# Patient Record
Sex: Female | Born: 1985 | Race: White | Hispanic: No | Smoking: Never smoker
Health system: Southern US, Community
[De-identification: ages and names within clinical notes are randomized; demographics above are authoritative.]

## PROBLEM LIST (undated history)

## (undated) DIAGNOSIS — O139 Gestational [pregnancy-induced] hypertension without significant proteinuria, unspecified trimester: Secondary | ICD-10-CM

## (undated) DIAGNOSIS — L709 Acne, unspecified: Secondary | ICD-10-CM

## (undated) DIAGNOSIS — R87629 Unspecified abnormal cytological findings in specimens from vagina: Secondary | ICD-10-CM

## (undated) DIAGNOSIS — O24419 Gestational diabetes mellitus in pregnancy, unspecified control: Secondary | ICD-10-CM

## (undated) HISTORY — PX: OTHER SURGICAL HISTORY: SHX169

## (undated) HISTORY — DX: Acne, unspecified: L70.9

## (undated) HISTORY — DX: Gestational diabetes mellitus in pregnancy, unspecified control: O24.419

## (undated) HISTORY — PX: TONSILLECTOMY: SUR1361

## (undated) HISTORY — PX: DRUG INDUCED ENDOSCOPY: SHX6808

## (undated) HISTORY — DX: Unspecified abnormal cytological findings in specimens from vagina: R87.629

## (undated) HISTORY — DX: Gestational (pregnancy-induced) hypertension without significant proteinuria, unspecified trimester: O13.9

---
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
## 2008-12-14 HISTORY — PX: CHOLECYSTECTOMY: SHX55

## 2008-12-30 ENCOUNTER — Ambulatory Visit: Payer: Self-pay | Admitting: Internal Medicine

## 2008-12-30 ENCOUNTER — Inpatient Hospital Stay (HOSPITAL_COMMUNITY): Admission: EM | Admit: 2008-12-30 | Discharge: 2009-01-06 | Payer: Self-pay | Admitting: Emergency Medicine

## 2009-01-05 ENCOUNTER — Encounter (INDEPENDENT_AMBULATORY_CARE_PROVIDER_SITE_OTHER): Payer: Self-pay | Admitting: General Surgery

## 2010-07-20 LAB — HEPATIC FUNCTION PANEL
Bilirubin, Direct: 0.4 mg/dL — ABNORMAL HIGH (ref 0.0–0.3)
Indirect Bilirubin: 0.8 mg/dL (ref 0.3–0.9)
Total Bilirubin: 1.2 mg/dL (ref 0.3–1.2)
Total Protein: 6.3 g/dL (ref 6.0–8.3)

## 2010-07-20 LAB — DIFFERENTIAL
Basophils Absolute: 0 10*3/uL (ref 0.0–0.1)
Basophils Absolute: 0.1 10*3/uL (ref 0.0–0.1)
Basophils Relative: 1 % (ref 0–1)
Eosinophils Absolute: 0 10*3/uL (ref 0.0–0.7)
Eosinophils Absolute: 0.1 10*3/uL (ref 0.0–0.7)
Eosinophils Relative: 1 % (ref 0–5)
Lymphocytes Relative: 15 % (ref 12–46)
Lymphs Abs: 1.4 10*3/uL (ref 0.7–4.0)
Monocytes Absolute: 0.8 10*3/uL (ref 0.1–1.0)
Monocytes Relative: 6 % (ref 3–12)
Monocytes Relative: 7 % (ref 3–12)
Neutro Abs: 9.6 10*3/uL — ABNORMAL HIGH (ref 1.7–7.7)

## 2010-07-20 LAB — URINALYSIS, ROUTINE W REFLEX MICROSCOPIC
Glucose, UA: NEGATIVE mg/dL
Ketones, ur: 15 mg/dL — AB
Nitrite: NEGATIVE
Protein, ur: NEGATIVE mg/dL
Urobilinogen, UA: 1 mg/dL (ref 0.0–1.0)
pH: 5 (ref 5.0–8.0)

## 2010-07-20 LAB — CBC
HCT: 38 % (ref 36.0–46.0)
HCT: 43.7 % (ref 36.0–46.0)
Hemoglobin: 12.4 g/dL (ref 12.0–15.0)
Hemoglobin: 12.9 g/dL (ref 12.0–15.0)
Hemoglobin: 14.9 g/dL (ref 12.0–15.0)
MCHC: 33.8 g/dL (ref 30.0–36.0)
MCHC: 34 g/dL (ref 30.0–36.0)
MCHC: 34 g/dL (ref 30.0–36.0)
MCHC: 34 g/dL (ref 30.0–36.0)
MCV: 86.3 fL (ref 78.0–100.0)
MCV: 86.7 fL (ref 78.0–100.0)
Platelets: 180 10*3/uL (ref 150–400)
Platelets: 196 10*3/uL (ref 150–400)
Platelets: 239 10*3/uL (ref 150–400)
RBC: 4.41 MIL/uL (ref 3.87–5.11)
RDW: 13.1 % (ref 11.5–15.5)
RDW: 13.5 % (ref 11.5–15.5)
RDW: 13.5 % (ref 11.5–15.5)
WBC: 12.1 10*3/uL — ABNORMAL HIGH (ref 4.0–10.5)
WBC: 4.5 10*3/uL (ref 4.0–10.5)

## 2010-07-20 LAB — COMPREHENSIVE METABOLIC PANEL
ALT: 141 U/L — ABNORMAL HIGH (ref 0–35)
ALT: 232 U/L — ABNORMAL HIGH (ref 0–35)
AST: 33 U/L (ref 0–37)
AST: 63 U/L — ABNORMAL HIGH (ref 0–37)
Alkaline Phosphatase: 85 U/L (ref 39–117)
Alkaline Phosphatase: 98 U/L (ref 39–117)
BUN: 4 mg/dL — ABNORMAL LOW (ref 6–23)
BUN: 8 mg/dL (ref 6–23)
CO2: 27 mEq/L (ref 19–32)
CO2: 28 mEq/L (ref 19–32)
Calcium: 9.1 mg/dL (ref 8.4–10.5)
Chloride: 104 mEq/L (ref 96–112)
GFR calc Af Amer: >60 mL/min (ref >60)
GFR calc Af Amer: >60 mL/min (ref >60)
GFR calc non Af Amer: >60 mL/min (ref >60)
GFR calc non Af Amer: >60 mL/min (ref >60)
Glucose, Bld: 91 mg/dL (ref 70–99)
Potassium: 3.3 mEq/L — ABNORMAL LOW (ref 3.5–5.1)
Potassium: 3.7 mEq/L (ref 3.5–5.1)
Sodium: 136 mEq/L (ref 135–145)
Sodium: 137 mEq/L (ref 135–145)
Sodium: 139 mEq/L (ref 135–145)
Total Bilirubin: 1.3 mg/dL — ABNORMAL HIGH (ref 0.3–1.2)
Total Protein: 6.5 g/dL (ref 6.0–8.3)

## 2010-07-20 LAB — URINE MICROSCOPIC-ADD ON

## 2010-07-20 LAB — MAGNESIUM: Magnesium: 1.9 mg/dL (ref 1.5–2.5)

## 2010-07-20 LAB — LIPASE, BLOOD: Lipase: 24 U/L (ref 11–59)

## 2010-07-20 LAB — PHOSPHORUS: Phosphorus: 3.3 mg/dL (ref 2.3–4.6)

## 2010-07-20 LAB — AMYLASE: Amylase: 130 U/L (ref 27–131)

## 2013-04-27 ENCOUNTER — Encounter: Payer: Self-pay | Admitting: Family Medicine

## 2013-04-27 ENCOUNTER — Ambulatory Visit (INDEPENDENT_AMBULATORY_CARE_PROVIDER_SITE_OTHER): Payer: PRIVATE HEALTH INSURANCE | Admitting: Family Medicine

## 2013-04-27 VITALS — BP 134/82 | HR 82 | Resp 16 | Wt 250.0 lb

## 2013-04-27 DIAGNOSIS — L708 Other acne: Secondary | ICD-10-CM

## 2013-04-27 DIAGNOSIS — R599 Enlarged lymph nodes, unspecified: Secondary | ICD-10-CM

## 2013-04-27 DIAGNOSIS — L700 Acne vulgaris: Secondary | ICD-10-CM

## 2013-04-27 DIAGNOSIS — E669 Obesity, unspecified: Secondary | ICD-10-CM

## 2013-04-27 DIAGNOSIS — R590 Localized enlarged lymph nodes: Secondary | ICD-10-CM

## 2013-04-27 DIAGNOSIS — Z23 Encounter for immunization: Secondary | ICD-10-CM

## 2013-04-27 MED ORDER — DOXYCYCLINE HYCLATE 50 MG PO CAPS: 50.0000 mg | ORAL_CAPSULE | Freq: Every day | ORAL | Status: DC

## 2013-04-27 MED ORDER — ADAPALENE-BENZOYL PEROXIDE 0.1-2.5 % EX GEL: 1.0000 "application " | Freq: Every day | CUTANEOUS | Status: DC

## 2013-04-27 NOTE — Progress Notes (Signed)
Subjective:    Patient ID: Julia [name], female    DOB: 09/15/1985, 28 y.o.   MRN: [id number]  HPI She's here to establish care today. She has had a lump under her chin for the last 2 months. It does seem to be resolving. He has never been painful or tender. She just noticed it was gradually getting larger over Christmas and over the last week has actually gotten a little bit smaller. She denies any recent illnesses fevers or chills or sweats. She does not take any medications.    Says her acne has really flared in 2 weeks.  Used to have acne on her back and chest but that has better.  Uses neturogena for her face wash.  Used ot use proactive. Used it for 2 years and then quit working.  She says her skin is very sensitive. When she comes in contact with other things it tends to break her out very easily.   Review of Systems  Constitutional: Negative for fever, diaphoresis and unexpected weight change.  HENT: Negative for hearing loss, rhinorrhea and tinnitus.   Eyes: Negative for visual disturbance.  Respiratory: Negative for cough and wheezing.   Cardiovascular: Negative for chest pain and palpitations.  Gastrointestinal: Negative for nausea, vomiting, diarrhea and blood in stool.  Genitourinary: Negative for vaginal bleeding, vaginal discharge and difficulty urinating.  Musculoskeletal: Negative for arthralgias and myalgias.  Skin: Negative for rash.  Neurological: Negative for headaches.  Hematological: Positive for adenopathy. Does not bruise/bleed easily.  Psychiatric/Behavioral: Positive for dysphoric mood. Negative for sleep disturbance. The patient is not nervous/anxious.     BP 134/82  Pulse 82  Resp 16  Wt 250 lb (113.399 kg)  SpO2 99%    Allergies  Allergen Reactions  . Morphine And Related     Grandfather died from morphine    History reviewed. No pertinent past medical history.  Past Surgical History  Procedure Laterality Date  . Cholecystectomy  12/2008     History   Social History  . Marital Status: Single    Spouse Name: N/A    Number of Children: N/A  . Years of Education: HS   Occupational History  . shipping/Receiving clear     [employer]   Social History Main Topics  . Smoking status: Never Smoker   . Smokeless tobacco: Never Used  . Alcohol Use: No  . Drug Use: No  . Sexual Activity: Yes    Partners: Male   Other Topics Concern  . Not on file   Social History Narrative   2 bottles of soda per day.  No regular exericise.  Lives with her mother and brother.    Family History  Problem Relation Age of Onset  . Alcohol abuse Father     Outpatient Encounter Prescriptions as of 04/27/2013  Medication Sig  . ibuprofen (ADVIL,MOTRIN) 200 MG tablet Take 200 mg by mouth every 6 (six) hours as needed.  . Adapalene-Benzoyl Peroxide (EPIDUO) 0.1-2.5 % gel Apply 1 application topically at bedtime.  Marland Kitchen doxycycline (VIBRAMYCIN) 50 MG capsule Take 1 capsule (50 mg total) by mouth daily.          Objective:   Physical Exam  Constitutional: She is oriented to person, place, and time. She appears well-developed and well-nourished.  HENT:  Head: Normocephalic and atraumatic.  Neck:  She does have a mildly swollen submandibular gland. It does not feel hard or indurated. It is nontender. No rash or fever around it.  Cardiovascular:  Normal rate, regular rhythm and normal heart sounds.   Pulmonary/Chest: Effort normal and breath sounds normal.  Neurological: She is alert and oriented to person, place, and time.  Skin: Skin is warm and dry.  She does have cystic acne really concentrated over her cheeks and chin.  Psychiatric: She has a normal mood and affect. Her behavior is normal.          Assessment & Plan:  Cystic acne - we discussed treatment options. She's really never used prescription treatment. Recommend a trial of epidural. Samples given today. If it's too expensive at the local pharmacy then please call me back.  We'll also treat with oral doxycycline 50 mg daily. She's not seeing some improvement within 4-6 weeks we can increase her dose to 100 mg.  Swollen lymph node - seems to be resolving on its own. Most suspicious for enlarged lymph node. May have been secondary to her cystic acne. If not completely resolving over the next 4 weeks and please let me know and we will do a CBC at that time.  Tdap updated today.  She will call to schedule a physical with Pap smear she has never had a Pap smear and has been socially active for 4 years at the end of the month.

## 2013-05-14 ENCOUNTER — Encounter: Payer: PRIVATE HEALTH INSURANCE | Admitting: Family Medicine

## 2013-07-26 ENCOUNTER — Other Ambulatory Visit: Payer: Self-pay | Admitting: Family Medicine

## 2013-07-26 ENCOUNTER — Telehealth: Payer: Self-pay | Admitting: *Deleted

## 2013-07-26 ENCOUNTER — Ambulatory Visit (INDEPENDENT_AMBULATORY_CARE_PROVIDER_SITE_OTHER): Payer: PRIVATE HEALTH INSURANCE | Admitting: Family Medicine

## 2013-07-26 ENCOUNTER — Encounter: Payer: Self-pay | Admitting: Family Medicine

## 2013-07-26 VITALS — BP 137/79 | HR 101 | Wt 250.0 lb

## 2013-07-26 DIAGNOSIS — R319 Hematuria, unspecified: Secondary | ICD-10-CM

## 2013-07-26 DIAGNOSIS — R1031 Right lower quadrant pain: Secondary | ICD-10-CM

## 2013-07-26 LAB — CBC WITH DIFFERENTIAL/PLATELET
BASOS ABS: 0 10*3/uL (ref 0.0–0.1)
BASOS PCT: 0 % (ref 0–1)
EOS ABS: 0.1 10*3/uL (ref 0.0–0.7)
EOS PCT: 2 % (ref 0–5)
HEMATOCRIT: 41.1 % (ref 36.0–46.0)
HEMOGLOBIN: 14 g/dL (ref 12.0–15.0)
Lymphocytes Relative: 33 % (ref 12–46)
Lymphs Abs: 1.9 10*3/uL (ref 0.7–4.0)
MCH: 27.6 pg (ref 26.0–34.0)
MCHC: 34.1 g/dL (ref 30.0–36.0)
MCV: 81.1 fL (ref 78.0–100.0)
MONO ABS: 0.6 10*3/uL (ref 0.1–1.0)
MONOS PCT: 10 % (ref 3–12)
Neutro Abs: 3.2 10*3/uL (ref 1.7–7.7)
Neutrophils Relative %: 55 % (ref 43–77)
Platelets: 284 10*3/uL (ref 150–400)
RBC: 5.07 MIL/uL (ref 3.87–5.11)
RDW: 14.5 % (ref 11.5–15.5)
WBC: 5.8 10*3/uL (ref 4.0–10.5)

## 2013-07-26 LAB — COMPLETE METABOLIC PANEL WITH GFR
ALT: 23 U/L (ref 0–35)
AST: 20 U/L (ref 0–37)
Albumin: 4.4 g/dL (ref 3.5–5.2)
Alkaline Phosphatase: 48 U/L (ref 39–117)
BILIRUBIN TOTAL: 0.5 mg/dL (ref 0.2–1.2)
BUN: 10 mg/dL (ref 6–23)
CO2: 25 mEq/L (ref 19–32)
CREATININE: 0.79 mg/dL (ref 0.50–1.10)
Calcium: 9.8 mg/dL (ref 8.4–10.5)
Chloride: 107 mEq/L (ref 96–112)
GLUCOSE: 87 mg/dL (ref 70–99)
Potassium: 4.9 mEq/L (ref 3.5–5.3)
Sodium: 140 mEq/L (ref 135–145)
Total Protein: 7.5 g/dL (ref 6.0–8.3)

## 2013-07-26 LAB — POCT URINALYSIS DIPSTICK
BILIRUBIN UA: NEGATIVE
Glucose, UA: NEGATIVE
KETONES UA: NEGATIVE
LEUKOCYTES UA: NEGATIVE
NITRITE UA: NEGATIVE
Protein, UA: 100
Spec Grav, UA: 1.025
Urobilinogen, UA: 0.2
pH, UA: 5.5

## 2013-07-26 LAB — POCT URINE PREGNANCY: Preg Test, Ur: NEGATIVE

## 2013-07-26 NOTE — Progress Notes (Signed)
Subjective:    Patient ID: Julia [name], female    DOB: 05/28/1985, 28 y.o.   MRN: [id number]  HPI RLQ - x 3 wks pain comes and goes lower right side of abdomen. Says when it happens it is sharp and sudden. It doesn't build up.  Says triggers sometime with urination, with BM and when kids sit on her lap.   She is worried about endometriosis and diabetes. Pain will usually last 2 hours. Still has her appendix.  No nausea but gets dizzy with it.  No fever.  Sometimes adding pressure helps it as well.  No constipation. No blood in the stool. She has had one abdominal surgery, cholecystectomy. No C-section et Karie Sodacetera. No blood in the stool. No recent illnesses. She's not currently trying to get pregnant though she is not on birth control.  Review of Systems  BP 137/79  Pulse 101  Wt 250 lb (113.399 kg)    Allergies  Allergen Reactions  . Morphine And Related     Grandfather died from morphine    No past medical history on file.  Past Surgical History  Procedure Laterality Date  . Cholecystectomy  12/2008    History   Social History  . Marital Status: Single    Spouse Name: N/A    Number of Children: N/A  . Years of Education: HS   Occupational History  . shipping/Receiving clear     [employer]   Social History Main Topics  . Smoking status: Never Smoker   . Smokeless tobacco: Never Used  . Alcohol Use: No  . Drug Use: No  . Sexual Activity: Yes    Partners: Male   Other Topics Concern  . Not on file   Social History Narrative   2 bottles of soda per day.  No regular exericise.  Lives with her mother and brother.    Family History  Problem Relation Age of Onset  . Alcohol abuse Father     Outpatient Encounter Prescriptions as of 07/26/2013  Medication Sig  . ibuprofen (ADVIL,MOTRIN) 200 MG tablet Take 200 mg by mouth every 6 (six) hours as needed.  . [DISCONTINUED] Adapalene-Benzoyl Peroxide (EPIDUO) 0.1-2.5 % gel Apply 1 application topically at bedtime.  .  [DISCONTINUED] doxycycline (VIBRAMYCIN) 50 MG capsule Take 1 capsule (50 mg total) by mouth daily.          Objective:   Physical Exam  Constitutional: She is oriented to person, place, and time. She appears well-developed and well-nourished.  HENT:  Head: Normocephalic and atraumatic.  Cardiovascular: Normal rate, regular rhythm and normal heart sounds.   Pulmonary/Chest: Effort normal and breath sounds normal.  Abdominal: Soft. Bowel sounds are normal. She exhibits no distension and no mass. There is tenderness. There is no rebound and no guarding.  TTP in the RLQ, LLQ and suprapubic.   Neurological: She is alert and oriented to person, place, and time.  Skin: Skin is warm and dry.  Psychiatric: She has a normal mood and affect. Her behavior is normal.          Assessment & Plan:  RLQ  Pain - unclear etiology. Could be assessed him every, versus endometriosis, versus UTI versus appendicitis (subacute).  Will check urinalysis as well as a CMP and a CBC. If normal will consider CT of abdomen for further evaluation since this is been going on for him is 3 weeks at this point in time. Recommend Tylenol or ibuprofen for acute pain relief. She is  no pregnant. Needs UA and upreg.

## 2013-07-26 NOTE — Telephone Encounter (Signed)
PA obtained for CT ABD/Pelvis w/contrast. Auth # [id number]. Good until 08/28/13.  Meyer CoryMisty Suhan Paci, LPN

## 2013-07-26 NOTE — Progress Notes (Signed)
Quick Note:  All labs are normal. ______ 

## 2013-07-28 LAB — URINE CULTURE: Colony Count: 8000

## 2013-07-29 ENCOUNTER — Other Ambulatory Visit: Payer: PRIVATE HEALTH INSURANCE

## 2013-08-06 ENCOUNTER — Ambulatory Visit (INDEPENDENT_AMBULATORY_CARE_PROVIDER_SITE_OTHER): Payer: PRIVATE HEALTH INSURANCE

## 2013-08-06 DIAGNOSIS — R1031 Right lower quadrant pain: Secondary | ICD-10-CM

## 2013-08-06 MED ORDER — IOHEXOL 300 MG/ML  SOLN
100.0000 mL | Freq: Once | INTRAMUSCULAR | Status: AC | PRN
  Administered 2013-08-06: 100 mL via INTRAVENOUS

## 2013-10-08 ENCOUNTER — Ambulatory Visit (INDEPENDENT_AMBULATORY_CARE_PROVIDER_SITE_OTHER): Payer: PRIVATE HEALTH INSURANCE | Admitting: Family

## 2013-10-08 ENCOUNTER — Encounter: Payer: Self-pay | Admitting: Family

## 2013-10-08 VITALS — BP 124/84 | HR 72 | Resp 16 | Ht 63.0 in | Wt 248.0 lb

## 2013-10-08 DIAGNOSIS — Z01419 Encounter for gynecological examination (general) (routine) without abnormal findings: Secondary | ICD-10-CM

## 2013-10-08 DIAGNOSIS — Z124 Encounter for screening for malignant neoplasm of cervix: Secondary | ICD-10-CM

## 2013-10-08 NOTE — Progress Notes (Signed)
  Subjective:     Julia [name] is a 28 y.o. female here for a routine exam.  Current complaints: noticed lump on right breast approximated 1.5 wks ago.  Non-painful.  Patient's last menstrual period was 09/20/2013.  Cycles are regular, occurs every 30 days, lasts 7 days.  Personal health questionnaire reviewed: yes.   Gynecologic History Patient's last menstrual period was 09/20/2013. Contraception: None, not in a relationship now.   Last Pap: Never had a pap. +sexually active in the past.   Last mammogram: never.   Obstetric History OB History  Gravida Para Term Preterm AB SAB TAB Ectopic Multiple Living  0 0 0 0 0 0 0 0 0 0          The following portions of the patient's history were reviewed and updated as appropriate: allergies, current medications, past family history, past medical history, past social history, past surgical history and problem list.  Review of Systems Pertinent items are noted in HPI.    Objective:   BP 124/84  Pulse 72  Resp 16  Ht 5\' 3"  (1.6 m)  Wt 248 lb (112.492 kg)  BMI 43.94 kg/m2  LMP 09/20/2013 General appearance: alert, cooperative and appears stated age Head: Normocephalic, without obvious abnormality, atraumatic Neck: no adenopathy, no carotid bruit, no JVD, supple, symmetrical, trachea midline and thyroid not enlarged, symmetric, no tenderness/mass/nodules Lungs: clear to auscultation bilaterally Breasts: normal appearance, no masses or tenderness, No nipple retraction or dimpling, No nipple discharge or bleeding, No axillary or supraclavicular adenopathy, Normal to palpation without dominant masses, Taught monthly breast self examination Heart: regular rate and rhythm, S1, S2 normal, no murmur, click, rub or gallop Abdomen: soft, non-tender; bowel sounds normal; no masses,  no organomegaly Pelvic: cervix normal in appearance, external genitalia normal, no adnexal masses or tenderness, no cervical motion tenderness, rectovaginal septum  normal, uterus normal size, shape, and consistency and vagina normal without discharge Skin: Skin color, texture, turgor normal. No rashes or lesions     Assessment:    Healthy female exam.    Plan:    Education reviewed: self breast exams. Follow up in: 1 year.  or if breast mass is palpated again - not present on today's exam.    Melissa NoonWalidah N Muhammad, CNM

## 2013-10-12 LAB — CYTOLOGY - PAP

## 2014-02-15 ENCOUNTER — Telehealth: Payer: Self-pay | Admitting: *Deleted

## 2014-02-15 NOTE — Telephone Encounter (Signed)
Pt called wanting to know blood type or if Dr. Linford ArnoldMetheney would test. I advised that she go donate blood thru [facility] to find this out.Julia PacasBarkley, Katurah Karapetian NaplesLynetta

## 2014-05-26 ENCOUNTER — Encounter: Payer: Self-pay | Admitting: Sports Medicine

## 2014-05-26 ENCOUNTER — Ambulatory Visit (INDEPENDENT_AMBULATORY_CARE_PROVIDER_SITE_OTHER): Payer: PRIVATE HEALTH INSURANCE | Admitting: Sports Medicine

## 2014-05-26 ENCOUNTER — Ambulatory Visit: Payer: 59

## 2014-05-26 VITALS — BP 138/88 | HR 84 | Ht 63.0 in | Wt 246.0 lb

## 2014-05-26 DIAGNOSIS — I82812 Embolism and thrombosis of superficial veins of left lower extremities: Secondary | ICD-10-CM

## 2014-05-26 DIAGNOSIS — R2242 Localized swelling, mass and lump, left lower limb: Secondary | ICD-10-CM | POA: Insufficient documentation

## 2014-05-26 MED ORDER — MELOXICAM 15 MG PO TABS: ORAL_TABLET | ORAL | Status: DC

## 2014-05-26 MED ORDER — AMBULATORY NON FORMULARY MEDICATION: Status: DC

## 2014-05-26 NOTE — Progress Notes (Signed)
   Subjective:    I'm seeing this patient as a consultation for:  Dr. Nani Gasseratherine Metheney  CC: Left leg pain  HPI: 1.5 weeks ago this pleasant 29 year old female fell, impacting the anterior aspect of her left lower shin, she had immediate pain, swelling, bruising. Unfortunately pain has been persistent, localized, with mild swelling of the left lower extremity.  Past medical history, Surgical history, Family history not pertinant except as noted below, Social history, Allergies, and medications have been entered into the medical record, reviewed, and no changes needed.   Review of Systems: No headache, visual changes, nausea, vomiting, diarrhea, constipation, dizziness, abdominal pain, skin rash, fevers, chills, night sweats, weight loss, swollen lymph nodes, body aches, joint swelling, muscle aches, chest pain, shortness of breath, mood changes, visual or auditory hallucinations.   Objective:   General: Well Developed, well nourished, and in no acute distress.  Neuro/Psych: Alert and oriented x3, extra-ocular muscles intact, able to move all 4 extremities, sensation grossly intact. Skin: Warm and dry, no rashes noted.  Respiratory: Not using accessory muscles, speaking in full sentences, trachea midline.  Cardiovascular: Pulses palpable, no extremity edema. Abdomen: Does not appear distended. Left leg: There is visible bruising, with an ecchymosis of approximately 6 cm across with exquisite tenderness. There is no sign of bacterial superinfection, tenderness is markedly without fluctuance. Negative Homans sign. Ankle has good strength and range of motion in all directions suggesting no injury of the underlying musculotendinous structures.  Procedure: Diagnostic Ultrasound of  left lower leg Device: GE Logiq E  Findings: No visible collection or hematoma, there are several approximately 1-2 cm hypoechoic structures that appear to be veins without compressibility suggesting internal  thrombosis. There is also no visible Doppler flow. Left Images permanently stored and available for review in the ultrasound unit.  Impression: Superficial venous thrombosis without a visible hematoma to aspirate.  Impression and Recommendations:   This case required medical decision making of moderate complexity.

## 2014-05-26 NOTE — Assessment & Plan Note (Signed)
Pain in the anterior shin after a fall 1.5 weeks ago. There is a very tender area under the bruise. We did an ultrasound to evaluate for collection but I do see what appears to be a thrombosed superficial vein with varicosities. Meloxicam, lower extremity compression hose, ice massage several times per day, I would like an official lower extremity Doppler ultrasound.

## 2014-06-09 ENCOUNTER — Encounter: Payer: Self-pay | Admitting: Sports Medicine

## 2014-06-09 ENCOUNTER — Ambulatory Visit (INDEPENDENT_AMBULATORY_CARE_PROVIDER_SITE_OTHER): Payer: 59 | Admitting: Sports Medicine

## 2014-06-09 DIAGNOSIS — M7989 Other specified soft tissue disorders: Secondary | ICD-10-CM

## 2014-06-09 NOTE — Progress Notes (Signed)
  Subjective:    CC: follow-up  HPI: Left leg pain: Julia [name] has what appears to be a fluid collection on her left anterior shin after a fall. We did an official ultrasound that showed what appeared to be a simple hematoma, I gave her some NSAIDs, and asked her to compress the extremity. She returns today with persistent pain, and swelling. Moderate, persistent without radiation.  Past medical history, Surgical history, Family history not pertinant except as noted below, Social history, Allergies, and medications have been entered into the medical record, reviewed, and no changes needed.   Review of Systems: No fevers, chills, night sweats, weight loss, chest pain, or shortness of breath.   Objective:    General: Well Developed, well nourished, and in no acute distress.  Neuro: Alert and oriented x3, extra-ocular muscles intact, sensation grossly intact.  HEENT: Normocephalic, atraumatic, pupils equal round reactive to light, neck supple, no masses, no lymphadenopathy, thyroid nonpalpable.  Skin: Warm and dry, no rashes. Cardiac: Regular rate and rhythm, no murmurs rubs or gallops, no lower extremity edema.  Respiratory: Clear to auscultation bilaterally. Not using accessory muscles, speaking in full sentences. Left leg: There is a palpable nodule, approximately 3cm across in the deep subcutaneous tissues. It's neither warm, nor indurated.it is well-defined and mobile.  Procedure: Real-time Ultrasound Guided aspiration/Injection of left shin subcutaneous collection Device: GE Logiq E  Verbal informed consent obtained.  Time-out conducted.  Noted no overlying erythema, induration, or other signs of local infection.  Skin prepped in a sterile fashion.  Local anesthesia: Topical Ethyl chloride.  With sterile technique and under real time ultrasound guidance:  Noted approximately 3 cm well-defined collection, with hypoechoic interior. An 18-gauge needle was advanced into this collection, and  approximately 2 mL of straw-colored, slightly cloudy fluid was aspirated, resulting in collapse of the cystic structure, syringe switched and 1 mL Kenalog 40, 1 mL lidocaine injected easily. Completed without difficulty  Pain immediately resolved suggesting accurate placement of the medication.  Advised to call if fevers/chills, erythema, induration, drainage, or persistent bleeding.  Images permanently stored and available for review in the ultrasound unit.  Impression: Technically successful ultrasound guided injection.  Impression and Recommendations:

## 2014-06-09 NOTE — Assessment & Plan Note (Signed)
Aspiration and injection as above. Sending off for fluid analysis and cultures. Return to see me in a month.

## 2014-06-11 LAB — SYNOVIAL CELL COUNT + DIFF, W/ CRYSTALS
Crystals, Fluid: NONE SEEN
Eosinophils-Synovial: 0 % (ref 0–1)
Lymphocytes-Synovial Fld: 93 % — ABNORMAL HIGH (ref 0–20)
Monocyte/Macrophage: 4 % — ABNORMAL LOW (ref 50–90)
Neutrophil, Synovial: 3 % (ref 0–25)
WBC, Synovial: 510 uL — ABNORMAL HIGH (ref 0–200)

## 2014-06-12 ENCOUNTER — Other Ambulatory Visit: Payer: Self-pay | Admitting: Sports Medicine

## 2014-06-15 LAB — BODY FLUID CULTURE
Culture: NO GROWTH
Gram Stain: NONE SEEN
Organism ID, Bacteria: NO GROWTH

## 2014-06-17 ENCOUNTER — Encounter: Payer: Self-pay | Admitting: Sports Medicine

## 2014-06-17 ENCOUNTER — Ambulatory Visit (INDEPENDENT_AMBULATORY_CARE_PROVIDER_SITE_OTHER): Payer: 59 | Admitting: Sports Medicine

## 2014-06-17 DIAGNOSIS — R2242 Localized swelling, mass and lump, left lower limb: Secondary | ICD-10-CM

## 2014-06-17 MED ORDER — DIAZEPAM 5 MG PO TABS: ORAL_TABLET | ORAL | Status: DC

## 2014-06-17 NOTE — Progress Notes (Signed)
  Subjective:    CC: Follow-up  HPI: Left leg mass: Occurred after recent trauma, ultrasound was unremarkable and negative for any DVT, or superficial thrombophlebitis. We did an aspiration and injection, fluid analysis, cell count and cultures were negative, and an injection. She had a good response temporarily but unfortunately a recurrence of the mass. No constitutional symptoms. It is tender.  Past medical history, Surgical history, Family history not pertinant except as noted below, Social history, Allergies, and medications have been entered into the medical record, reviewed, and no changes needed.   Review of Systems: No fevers, chills, night sweats, weight loss, chest pain, or shortness of breath.   Objective:    General: Well Developed, well nourished, and in no acute distress.  Neuro: Alert and oriented x3, extra-ocular muscles intact, sensation grossly intact.  HEENT: Normocephalic, atraumatic, pupils equal round reactive to light, neck supple, no masses, no lymphadenopathy, thyroid nonpalpable.  Skin: Warm and dry, no rashes. Cardiac: Regular rate and rhythm, no murmurs rubs or gallops, no lower extremity edema.  Respiratory: Clear to auscultation bilaterally. Not using accessory muscles, speaking in full sentences. Left leg: There is a tender, well-defined, round mass approximately 1 inch in diameter in the anterior aspect of her left lower leg. There is mild overlying bruising from the recent aspiration procedure.  Procedure: Diagnostic Ultrasound of  left lower leg Device: GE Logiq E  Findings: Noted a hypoechoic structure approximately 1.5 x 1.3 x 2 cm across with subtle internal echo, suggestive of a subcutaneous cyst, it does appear superficial to the anterior muscular compartment and fascia. Images permanently stored and available for review in the ultrasound unit.  Impression: Subcutaneous cyst with subtle internal echoes. Recommend MR with IV contrast for further  evaluation.  Impression and Recommendations:

## 2014-06-17 NOTE — Assessment & Plan Note (Signed)
Ultrasound shows a 1.3 x 1.5 x 2 cm hypoechoic mass with subtle internal echoes suggestive of a simple cyst only in the subcutaneous tissues. We are going to obtain an MRI with IV contrast for further delineation, and if this proves to be a simple cyst we will do an excision in the office using a tourniquet over her calf to help with exposure. I will see her back to go over MRI results, ideally in a 30 minute slot for anticipated excision.

## 2014-06-22 ENCOUNTER — Telehealth: Payer: Self-pay

## 2014-06-22 NOTE — Telephone Encounter (Signed)
PA required for MR tibia fibula left with and without contrast - Approved - [id number]

## 2014-06-27 ENCOUNTER — Ambulatory Visit (INDEPENDENT_AMBULATORY_CARE_PROVIDER_SITE_OTHER): Payer: 59

## 2014-06-27 DIAGNOSIS — R600 Localized edema: Secondary | ICD-10-CM

## 2014-06-27 DIAGNOSIS — R2242 Localized swelling, mass and lump, left lower limb: Secondary | ICD-10-CM

## 2014-06-27 MED ORDER — GADOBENATE DIMEGLUMINE 529 MG/ML IV SOLN: 20.0000 mL/kg | Freq: Once | INTRAVENOUS | Status: DC | PRN

## 2014-06-27 MED ORDER — GADOBENATE DIMEGLUMINE 529 MG/ML IV SOLN
20.0000 mL | Freq: Once | INTRAVENOUS | Status: AC | PRN
  Administered 2014-06-27: 20 mL via INTRAVENOUS

## 2014-07-01 ENCOUNTER — Ambulatory Visit (INDEPENDENT_AMBULATORY_CARE_PROVIDER_SITE_OTHER): Payer: 59 | Admitting: Sports Medicine

## 2014-07-01 ENCOUNTER — Encounter: Payer: Self-pay | Admitting: Sports Medicine

## 2014-07-01 DIAGNOSIS — R2242 Localized swelling, mass and lump, left lower limb: Secondary | ICD-10-CM

## 2014-07-01 MED ORDER — HYDROCODONE-ACETAMINOPHEN 5-325 MG PO TABS: 1.0000 | ORAL_TABLET | Freq: Three times a day (TID) | ORAL | Status: DC | PRN

## 2014-07-01 NOTE — Assessment & Plan Note (Signed)
Surgical excision as above. Incision was closed with 2 Prolene horizontal mattress sutures and a 5-0 Vicryl running subcuticular. Return in one week for reinforcing suture removal. Hydrocodone for postoperative pain.

## 2014-07-01 NOTE — Progress Notes (Signed)
  Procedure:  Excision of  left leg 4 cm mass Risks, benefits, and alternatives explained and consent obtained. Time out conducted. Surface prepped with alcohol. 5cc lidocaine with epinephine infiltrated in a field block. Adequate anesthesia ensured. Area prepped and draped in a sterile fashion. Excision performed with: The left leg was exsanguinated and blood pressure cuff inflated to 200 mm of mercury.  Area was prepped and draped in a sterile fashion, using a #15 blade and made a transverse incision approximately 2 inches across the mass, using both sharp and blunt dissection I was able to enter the cystic structure, which was a without a wall. Some serosanguineous discharge was noted, and I then removed the surrounding subcutaneous tissues. These were sent off. The dissection was carried down to the crural fascia, and I probed the area with my finger and felt no further masses.   I then placed two 4-0 Prolene horizontal mattress sutures to remove tension across the wound, afterwards we used a 5-0 Vicryl suture in a running subcuticular pattern to close the wound. Blood pressure cuff was deflated and noted good hemostasis. Patient was neurovascularly intact distally in all nerve distributions after the procedure. Pt stable.

## 2014-07-07 ENCOUNTER — Encounter: Payer: Self-pay | Admitting: Sports Medicine

## 2014-07-07 ENCOUNTER — Ambulatory Visit (INDEPENDENT_AMBULATORY_CARE_PROVIDER_SITE_OTHER): Payer: 59 | Admitting: Sports Medicine

## 2014-07-07 VITALS — BP 151/96 | HR 83 | Ht 63.0 in | Wt 247.0 lb

## 2014-07-07 DIAGNOSIS — R2242 Localized swelling, mass and lump, left lower limb: Secondary | ICD-10-CM

## 2014-07-07 LAB — FUNGUS CULTURE W SMEAR: Smear Result: NONE SEEN

## 2014-07-07 NOTE — Assessment & Plan Note (Signed)
Pathology results of removed tissue shows fat necrosis as expected. Incision is doing extremely well. Next line return in 2 weeks, I did remove the reinforcing sutures today, and we are leaving Steri-Strips in place.  She does have 5-0 Vicryl running subcuticular absorbable sutures.

## 2014-07-07 NOTE — Progress Notes (Signed)
  Subjective: 1 week post exploration of the left anterior leg, excision of cystic fat necrosis. Wound is closed with reinforcing Prolene and running subcuticular absorbable Vicryl. She is doing extremely well and has very little pain.  Objective: General: Well-developed, well-nourished, and in no acute distress. Left leg: Incision is clean, dry, intact, I removed the reinforcing Prolene sutures, we are leaving the Steri-Strips in place, and the wound looks fantastic, no signs of bacterial infection.  Assessment/plan:

## 2014-07-21 ENCOUNTER — Ambulatory Visit (INDEPENDENT_AMBULATORY_CARE_PROVIDER_SITE_OTHER): Payer: 59 | Admitting: Sports Medicine

## 2014-07-21 ENCOUNTER — Encounter: Payer: Self-pay | Admitting: Sports Medicine

## 2014-07-21 VITALS — BP 128/71 | HR 69 | Ht 63.0 in | Wt 248.0 lb

## 2014-07-21 DIAGNOSIS — R2242 Localized swelling, mass and lump, left lower limb: Secondary | ICD-10-CM

## 2014-07-21 NOTE — Assessment & Plan Note (Signed)
Overall healing well after surgical exploration of leg nodule, pathology did show simple fat necrosis, completely excised. She can return to see me on an as-needed basis at this point.

## 2014-07-21 NOTE — Progress Notes (Signed)
  Subjective: 3 weeks post surgical expiration of a nodule in the left lower leg, I performed a complete excision, and we closed it primarily. She is doing extremely well, pain has returned to better than baseline, and pathology showed fat necrosis.   Objective: General: Well-developed, well-nourished, and in no acute distress. Left leg: Wound is well-healed, there is a small scab overlying, it is clean, dry, intact with slight scaling.  Assessment/plan:

## 2014-07-24 LAB — AFB CULTURE WITH SMEAR (NOT AT ARMC): Acid Fast Smear: NONE SEEN

## 2015-01-12 ENCOUNTER — Emergency Department
Admission: EM | Admit: 2015-01-12 | Discharge: 2015-01-12 | Disposition: A | Discharge status: Home / Self Care | Payer: 59 | Source: Home / Self Care | Attending: Family Medicine | Admitting: Family Medicine

## 2015-01-12 ENCOUNTER — Encounter: Payer: Self-pay | Admitting: Emergency Medicine

## 2015-01-12 DIAGNOSIS — L03114 Cellulitis of left upper limb: Secondary | ICD-10-CM

## 2015-01-12 MED ORDER — CLINDAMYCIN HCL 150 MG PO CAPS: 300.0000 mg | ORAL_CAPSULE | Freq: Three times a day (TID) | ORAL | Status: DC

## 2015-01-12 NOTE — Discharge Instructions (Signed)
°  Please take antibiotics as prescribed and be sure to complete entire course even if you start to feel better to ensure infection does not come back. ° °

## 2015-01-12 NOTE — ED Notes (Signed)
Left arm redness, pain x 2 days, slight nausea

## 2015-01-12 NOTE — ED Provider Notes (Signed)
CSN: [id number]     Arrival date & time 01/12/15  0803 History   First MD Initiated Contact with Patient 01/12/15 0818     Chief Complaint  Patient presents with  . Cellulitis   (Consider location/radiation/quality/duration/timing/severity/associated sxs/prior Treatment) HPI Pt is a 29yo female presenting to [facility] with c/o gradually worsening, sudden onset Left upper arm pain, redness, and swelling. Pain is aching and throbbing, worse with palpation and certain movements, 6/10 at worst.  Symptoms started 2 days ago when she woke up. She did not see any insects and denies injury to arm.  Reports mild nausea and subjective fever. No vomiting. No sick contacts or recent travel. Denies new soaps, lotions, or medications.  Past Medical History  Diagnosis Date  . Acne    Past Surgical History  Procedure Laterality Date  . Cholecystectomy  12/2008  . Tonsillectomy     Family History  Problem Relation Age of Onset  . Alcohol abuse Father   . Alcohol abuse Maternal Grandmother   . Asthma Mother   . Hypertension Mother    Social History  Substance Use Topics  . Smoking status: Never Smoker   . Smokeless tobacco: Never Used  . Alcohol Use: Yes     Comment: 3 x monthly   OB History    Gravida Para Term Preterm AB TAB SAB Ectopic Multiple Living       Review of Systems  Constitutional: Positive for fever ( subjective). Negative for chills.  Gastrointestinal: Positive for nausea. Negative for vomiting and diarrhea.  Musculoskeletal: Positive for myalgias. Negative for arthralgias.       Left arm  Skin: Positive for color change and rash. Negative for pallor and wound.  Neurological: Negative for weakness and numbness.    Allergies  Morphine and related  Home Medications   Prior to Admission medications   Medication Sig Start Date End Date Taking? Authorizing Provider  AMBULATORY NON FORMULARY MEDICATION Knee-high, medium compression, graduated compression  stockings. Apply to lower extremities. 05/26/14   Monica Becton, MD  clindamycin (CLEOCIN) 150 MG capsule Take 2 capsules (300 mg total) by mouth 3 (three) times daily. For 7 days 01/12/15   Junius Finner, PA-C  ibuprofen (ADVIL,MOTRIN) 200 MG tablet Take 200 mg by mouth every 6 (six) hours as needed.    Historical Provider, MD  meloxicam (MOBIC) 15 MG tablet One tab PO qAM with breakfast for 2 weeks, then daily prn pain. 05/26/14   Monica Becton, MD   Meds Ordered and Administered this Visit  Medications - No data to display  BP 130/86 mmHg  Pulse 95  Temp(Src) 97.8 F (36.6 C) (Oral)  Ht  (1.6 m)  Wt 265 lb (120.203 kg)  BMI 46.95 kg/m2  SpO2 97%  LMP 12/31/2014 No data found.   Physical Exam  Constitutional: She is oriented to person, place, and time. She appears well-developed and well-nourished.  HENT:  Head: Normocephalic and atraumatic.  Eyes: EOM are normal.  Neck: Normal range of motion.  Cardiovascular: Normal rate.   Pulmonary/Chest: Effort normal.  Musculoskeletal: Normal range of motion. She exhibits edema and tenderness.       Arms: Left upper arm: tenderness to anterior lateral aspect (see skin exam). Full ROM Left shoulder and elbow.  Neurological: She is alert and oriented to person, place, and time.  Skin: Skin is warm and dry. There is erythema.  Left upper arm: anterior lateral  aspect- 3cm area of erythema, 1cm area induration and tenderness just distal to elbow joint. No fluctuance. No bleeding or discharge. No red streaking. Skin in tact.   Psychiatric: She has a normal mood and affect. Her behavior is normal.  Nursing note and vitals reviewed.   ED Course  Procedures (including critical care time)  Labs Review Labs Reviewed - No data to display  Imaging Review No results found.    MDM   1. Cellulitis of left arm     Pt c/o Left upper arm pain and swelling, tender to touch with induration but no fluctuance. Hx and exam  c/w cellulitis without abscess. Rx: clindamycin. Discussed warm compresses and elevation. F/u with PCP in 4-5 days if not improving, sooner if worsening.  Patient verbalized understanding and agreement with treatment plan.     Junius Finner, PA-C 01/12/15 [phone]

## 2015-06-09 ENCOUNTER — Emergency Department
Admission: EM | Admit: 2015-06-09 | Discharge: 2015-06-09 | Disposition: A | Discharge status: Home / Self Care | Payer: 59 | Source: Home / Self Care | Attending: Family Medicine | Admitting: Family Medicine

## 2015-06-09 DIAGNOSIS — J069 Acute upper respiratory infection, unspecified: Secondary | ICD-10-CM

## 2015-06-09 DIAGNOSIS — R0981 Nasal congestion: Secondary | ICD-10-CM

## 2015-06-09 LAB — POCT INFLUENZA A/B
Influenza A, POC: NEGATIVE
Influenza B, POC: NEGATIVE

## 2015-06-09 MED ORDER — SALINE SPRAY 0.65 % NA SOLN: 1.0000 | NASAL | Status: DC | PRN

## 2015-06-09 MED ORDER — AMOXICILLIN-POT CLAVULANATE 875-125 MG PO TABS: 1.0000 | ORAL_TABLET | Freq: Two times a day (BID) | ORAL | Status: DC

## 2015-06-09 MED ORDER — OXYMETAZOLINE HCL 0.05 % NA SOLN: 1.0000 | Freq: Two times a day (BID) | NASAL | Status: DC

## 2015-06-09 MED ORDER — BENZONATATE 100 MG PO CAPS: 100.0000 mg | ORAL_CAPSULE | Freq: Three times a day (TID) | ORAL | Status: DC

## 2015-06-09 NOTE — ED Provider Notes (Signed)
CSN: [id number]     Arrival date & time 06/09/15  1733 History   First MD Initiated Contact with Patient 06/09/15 1747     Chief Complaint  Patient presents with  . Cough   (Consider location/radiation/quality/duration/timing/severity/associated sxs/prior Treatment) HPI  The pt is a 30yo female presenting to [facility] with c/o subjective fever, chills, body aches, sinus congestion with pressure, sore throat, and rhinorrhea for 3 days.  She has had a mild intermittent productive cough.  Her sinus pressure and congestion is most bothersome for her.  She has been taking Dayquil and Mucinex.  Denies n/v/d. Denies chest pain or SOB. She did not get the flu vaccine this year.  Past Medical History  Diagnosis Date  . Acne    Past Surgical History  Procedure Laterality Date  . Cholecystectomy  12/2008  . Tonsillectomy     Family History  Problem Relation Age of Onset  . Alcohol abuse Father   . Alcohol abuse Maternal Grandmother   . Asthma Mother   . Hypertension Mother    Social History  Substance Use Topics  . Smoking status: Never Smoker   . Smokeless tobacco: Never Used  . Alcohol Use: Yes     Comment: 3 x monthly   OB History    Gravida Para Term Preterm AB TAB SAB Ectopic Multiple Living       Review of Systems  Constitutional: Positive for fever and chills.  HENT: Positive for congestion, rhinorrhea, sinus pressure, sneezing and sore throat. Negative for ear pain, trouble swallowing and voice change.   Respiratory: Positive for cough and shortness of breath.   Cardiovascular: Negative for chest pain and palpitations.  Gastrointestinal: Positive for diarrhea ( loose stool). Negative for nausea, vomiting and abdominal pain.  Musculoskeletal: Negative for myalgias, back pain and arthralgias.  Skin: Negative for rash.  Neurological: Positive for headaches. Negative for dizziness and light-headedness.    Allergies  Morphine and related  Home Medications    Prior to Admission medications   Medication Sig Start Date End Date Taking? Authorizing Provider  AMBULATORY NON FORMULARY MEDICATION Knee-high, medium compression, graduated compression stockings. Apply to lower extremities. 05/26/14   Monica Becton, MD  amoxicillin-clavulanate (AUGMENTIN) 875-125 MG tablet Take 1 tablet by mouth 2 (two) times daily. One po bid x 7 days 06/09/15   Junius Finner, PA-C  benzonatate (TESSALON) 100 MG capsule Take 1 capsule (100 mg total) by mouth every 8 (eight) hours. 06/09/15   Junius Finner, PA-C  clindamycin (CLEOCIN) 150 MG capsule Take 2 capsules (300 mg total) by mouth 3 (three) times daily. For 7 days 01/12/15   Junius Finner, PA-C  ibuprofen (ADVIL,MOTRIN) 200 MG tablet Take 200 mg by mouth every 6 (six) hours as needed.    Historical Provider, MD  meloxicam (MOBIC) 15 MG tablet One tab PO qAM with breakfast for 2 weeks, then daily prn pain. 05/26/14   Monica Becton, MD  oxymetazoline (AFRIN NASAL SPRAY) 0.05 % nasal spray Place 1 spray into both nostrils 2 (two) times daily. No longer than 4 consecutive days 06/09/15   Junius Finner, PA-C  sodium chloride (OCEAN) 0.65 % SOLN nasal spray Place 1 spray into both nostrils as needed. 06/09/15   Junius Finner, PA-C   Meds Ordered and Administered this Visit  Medications - No data to display  BP 130/87 mmHg  Pulse 82  Temp(Src) 97.4 F (36.3 C) (Oral)  Ht 5'  3" (1.6 m)  Wt 249 lb 6.4 oz (113.127 kg)  BMI 44.19 kg/m2  SpO2 98%  LMP 06/06/2015 No data found.   Physical Exam  Constitutional: She appears well-developed and well-nourished. No distress.  HENT:  Head: Normocephalic and atraumatic.  Right Ear: Tympanic membrane normal.  Left Ear: Tympanic membrane normal.  Nose: Mucosal edema and rhinorrhea present. Right sinus exhibits maxillary sinus tenderness. Right sinus exhibits no frontal sinus tenderness. Left sinus exhibits no maxillary sinus tenderness and no frontal sinus  tenderness.  Mouth/Throat: Uvula is midline, oropharynx is clear and moist and mucous membranes are normal.  Eyes: Conjunctivae are normal. No scleral icterus.  Neck: Normal range of motion. Neck supple.  Cardiovascular: Normal rate, regular rhythm and normal heart sounds.   Pulmonary/Chest: Effort normal and breath sounds normal. No respiratory distress. She has no wheezes. She has no rales. She exhibits no tenderness.  Abdominal: Soft. She exhibits no distension. There is no tenderness.  Musculoskeletal: Normal range of motion.  Neurological: She is alert.  Skin: Skin is warm and dry. She is not diaphoretic.  Nursing note and vitals reviewed.   ED Course  Procedures (including critical care time)  Labs Review Labs Reviewed  POCT INFLUENZA A/B    Imaging Review No results found.    MDM   1. Acute upper respiratory infection   2. Sinus congestion    Pt presenting to [facility] with URI symptoms, worse sinus symptoms. Exposure to flu.  Rapid flu: negative Pt does not want preventative flu medication  Rx: augmentin, tessalon, nasal saline, and afrin.  Advised pt to use acetaminophen and ibuprofen as needed for fever and pain. Encouraged rest and fluids. F/u with PCP in 7-10 days if not improving, sooner if worsening. Pt verbalized understanding and agreement with tx plan.    Junius Finner, PA-C 06/09/15 [phone]

## 2015-06-09 NOTE — Discharge Instructions (Signed)
You may take 400-600mg Ibuprofen (Motrin) every 6-8 hours for fever and pain  °Alternate with Tylenol  °You may take 500mg Tylenol every 4-6 hours as needed for fever and pain  °Follow-up with your primary care provider next week for recheck of symptoms if not improving.  °Be sure to drink plenty of fluids and rest, at least 8hrs of sleep a night, preferably more while you are sick. °Return urgent care or go to closest ER if you cannot keep down fluids/signs of dehydration, fever not reducing with Tylenol, difficulty breathing/wheezing, stiff neck, worsening condition, or other concerns (see below)  °Please take antibiotics as prescribed and be sure to complete entire course even if you start to feel better to ensure infection does not come back. ° ° °Cool Mist Vaporizers °Vaporizers may help relieve the symptoms of a cough and cold. They add moisture to the air, which helps mucus to become thinner and less sticky. This makes it easier to breathe and cough up secretions. Cool mist vaporizers do not cause serious burns like hot mist vaporizers, which may also be called steamers or humidifiers. Vaporizers have not been proven to help with colds. You should not use a vaporizer if you are allergic to mold. °HOME CARE INSTRUCTIONS °· Follow the package instructions for the vaporizer. °· Do not use anything other than distilled water in the vaporizer. °· Do not run the vaporizer all of the time. This can cause mold or bacteria to grow in the vaporizer. °· Clean the vaporizer after each time it is used. °· Clean and dry the vaporizer well before storing it. °· Stop using the vaporizer if worsening respiratory symptoms develop. °  °This information is not intended to replace advice given to you by your health care provider. Make sure you discuss any questions you have with your health care provider. °  °Document Released: 12/28/2003 Document Revised: 04/06/2013 Document Reviewed: 08/19/2012 °Elsevier Interactive Patient  Education ©2016 Elsevier Inc. ° °

## 2015-06-09 NOTE — ED Notes (Signed)
Niece who lives with patient tested positive for the flu.  Patient started with fever and chills Wednesday, body aches followed.  Sinus pressure and pain,sore throat, runny nose.

## 2015-06-22 ENCOUNTER — Ambulatory Visit (INDEPENDENT_AMBULATORY_CARE_PROVIDER_SITE_OTHER): Payer: BLUE CROSS/BLUE SHIELD | Admitting: Family Medicine

## 2015-06-22 ENCOUNTER — Encounter: Payer: Self-pay | Admitting: Family Medicine

## 2015-06-22 VITALS — BP 112/73 | HR 91 | Temp 98.5°F | Wt 250.0 lb

## 2015-06-22 DIAGNOSIS — J0100 Acute maxillary sinusitis, unspecified: Secondary | ICD-10-CM | POA: Diagnosis not present

## 2015-06-22 MED ORDER — PREDNISONE 20 MG PO TABS: 40.0000 mg | ORAL_TABLET | Freq: Every day | ORAL | Status: DC

## 2015-06-22 MED ORDER — LEVOFLOXACIN 500 MG PO TABS: 500.0000 mg | ORAL_TABLET | Freq: Every day | ORAL | Status: AC

## 2015-06-22 NOTE — Patient Instructions (Signed)

## 2015-06-22 NOTE — Progress Notes (Signed)
   Subjective:    Patient ID: Julia [name], female    DOB: 04/12/1986, 30 y.o.   MRN: [id number]  HPI Patient comes in today feeling like she still has an upper respiratory infection. She started feeling bad on February 21. On February 24 she went to urgent care for fevers chills body aches sinus congestion sore throat and runny nose that she had for 3 days at that point. She was started on Augmentin and Tessalon Perles. She is coming in today because she still does not feel well. She still has green and yellow mucus. She's been taking Mucinex without relief. She has had pain in her cheeks x 3 days. She says she never actually felt better even on the antibiotic. A fever this last week. She has had some chills and sweats.   Review of Systems     Objective:   Physical Exam  Constitutional: She is oriented to person, place, and time. She appears well-developed and well-nourished.  HENT:  Head: Normocephalic and atraumatic.  Right Ear: External ear normal.  Left Ear: External ear normal.  Nose: Nose normal.  Mouth/Throat: Oropharynx is clear and moist.  TMs and canals are clear.   Eyes: Conjunctivae and EOM are normal. Pupils are equal, round, and reactive to light.  Neck: Neck supple. No thyromegaly present.  Cardiovascular: Normal rate, regular rhythm and normal heart sounds.   Pulmonary/Chest: Effort normal and breath sounds normal. She has no wheezes.  Lymphadenopathy:    She has no cervical adenopathy.  Neurological: She is alert and oriented to person, place, and time.  Skin: Skin is warm and dry.  Psychiatric: She has a normal mood and affect.          Assessment & Plan:  Acute sinusitis-we'll go ahead and treat with Levaquin since she has failed Augmentin. Also give her 5 days of prednisone. Warned about potential side effects of prednisone and she can stop it immediately if it's causing any present problems or concerns. Call if not better in one week

## 2015-07-14 ENCOUNTER — Encounter: Payer: Self-pay | Admitting: Family Medicine

## 2015-07-14 ENCOUNTER — Ambulatory Visit (INDEPENDENT_AMBULATORY_CARE_PROVIDER_SITE_OTHER): Payer: BLUE CROSS/BLUE SHIELD | Admitting: Family Medicine

## 2015-07-14 VITALS — BP 130/86 | HR 82 | Temp 98.3°F | Wt 257.0 lb

## 2015-07-14 DIAGNOSIS — J029 Acute pharyngitis, unspecified: Secondary | ICD-10-CM | POA: Diagnosis not present

## 2015-07-14 LAB — POCT RAPID STREP A (OFFICE): Rapid Strep A Screen: POSITIVE — AB

## 2015-07-14 MED ORDER — AMOXICILLIN 500 MG PO CAPS: 500.0000 mg | ORAL_CAPSULE | Freq: Three times a day (TID) | ORAL | Status: DC

## 2015-07-14 NOTE — Addendum Note (Signed)
Addended by: Thom ChimesHENRY, Dayla Gasca M on: 07/14/2015 01:38 PM   Modules accepted: Orders, SmartSet

## 2015-07-14 NOTE — Progress Notes (Signed)
CC: Julia [name] is a 30 y.o. female is here for Sore Throat   Subjective: HPI:  Left-sided sore throat with subjective fever, chills, lightheadedness that has been present for 3 days and worsening. Pain is worse with swallowing. No benefit from cough drops or Chloraseptic spray. Denies rash, headache, confusion, abdominal pain or cough. No diarrhea or gastrointestinal complaints. No rash. Symptoms are to moderate in severity and persistent   Review Of Systems Outlined In HPI  Past Medical History  Diagnosis Date  . Acne     Past Surgical History  Procedure Laterality Date  . Cholecystectomy  12/2008  . Tonsillectomy     Family History  Problem Relation Age of Onset  . Alcohol abuse Father   . Alcohol abuse Maternal Grandmother   . Asthma Mother   . Hypertension Mother     Social History   Social History  . Marital Status: Single    Spouse Name: N/A  . Number of Children: N/A  . Years of Education: HS   Occupational History  . shipping/Receiving clear     [employer]   Social History Main Topics  . Smoking status: Never Smoker   . Smokeless tobacco: Never Used  . Alcohol Use: Yes     Comment: 3 x monthly  . Drug Use: No  . Sexual Activity:    Partners: Male   Other Topics Concern  . Not on file   Social History Narrative   2 bottles of soda per day.  No regular exericise.  Lives with her mother and brother.     Objective: BP 130/86 mmHg  Pulse 82  Temp(Src) 98.3 F (36.8 C) (Oral)  Wt 257 lb (116.574 kg)  SpO2 96%  General: Alert and Oriented, No Acute Distress HEENT: Pupils equal, round, reactive to light. Conjunctivae clear.  External ears unremarkable, canals clear with intact TMs with appropriate landmarks.  Middle ear appears open without effusion. Pink inferior turbinates.  Moist mucous membranes, pharynx With mild pharyngeal erythema and white spots on the left tonsil..  Neck supple without palpable lymphadenopathy nor abnormal masses. Extremities:  No peripheral edema.  Strong peripheral pulses.  Mental Status: No depression, anxiety, nor agitation. Skin: Warm and dry.  Assessment & Plan: Julia [name] was seen today for sore throat.  Diagnoses and all orders for this visit:  Acute pharyngitis, unspecified etiology  Other orders -     amoxicillin (AMOXIL) 500 MG capsule; Take 1 capsule (500 mg total) by mouth 3 (three) times daily.    Strep test positive with suspicious symptoms suggesting strep pharyngitis therefore start amoxicillin and consider ibuprofen for discomfort.  Return if symptoms worsen or fail to improve.

## 2015-09-13 ENCOUNTER — Ambulatory Visit: Payer: BLUE CROSS/BLUE SHIELD | Admitting: Obstetrics & Gynecology

## 2015-09-13 DIAGNOSIS — Z01419 Encounter for gynecological examination (general) (routine) without abnormal findings: Secondary | ICD-10-CM

## 2015-09-20 ENCOUNTER — Encounter: Payer: Self-pay | Admitting: Physician Assistant

## 2015-09-20 ENCOUNTER — Ambulatory Visit (INDEPENDENT_AMBULATORY_CARE_PROVIDER_SITE_OTHER): Payer: BLUE CROSS/BLUE SHIELD | Admitting: Physician Assistant

## 2015-09-20 VITALS — BP 131/71 | HR 62 | Ht 63.0 in | Wt 246.0 lb

## 2015-09-20 DIAGNOSIS — R748 Abnormal levels of other serum enzymes: Secondary | ICD-10-CM | POA: Diagnosis not present

## 2015-09-20 DIAGNOSIS — M549 Dorsalgia, unspecified: Secondary | ICD-10-CM | POA: Insufficient documentation

## 2015-09-20 DIAGNOSIS — R109 Unspecified abdominal pain: Secondary | ICD-10-CM

## 2015-09-20 DIAGNOSIS — R1115 Cyclical vomiting syndrome unrelated to migraine: Secondary | ICD-10-CM

## 2015-09-20 DIAGNOSIS — R1013 Epigastric pain: Secondary | ICD-10-CM | POA: Insufficient documentation

## 2015-09-20 DIAGNOSIS — G43A0 Cyclical vomiting, not intractable: Secondary | ICD-10-CM

## 2015-09-20 MED ORDER — SUCRALFATE 1 G PO TABS: 1.0000 g | ORAL_TABLET | Freq: Four times a day (QID) | ORAL | Status: DC

## 2015-09-20 MED ORDER — TRAMADOL HCL 50 MG PO TABS: 50.0000 mg | ORAL_TABLET | Freq: Three times a day (TID) | ORAL | Status: DC | PRN

## 2015-09-20 MED ORDER — OMEPRAZOLE 40 MG PO CPDR: 40.0000 mg | DELAYED_RELEASE_CAPSULE | Freq: Every day | ORAL | Status: DC

## 2015-09-20 NOTE — Progress Notes (Signed)
   Subjective:    Patient ID: Julia [name], female    DOB: 04/02/1986, 30 y.o.   MRN: [id number]  HPI  Patient is a 30 year old female who presents to the clinic with epigastric pain that radiates to the back, nausea and vomiting. Symptoms started 09/18/2015 which was Monday night. She went to bed after an episode of loose stool and woke up in the middle of the night with intense abdominal pain. [facility]. CBC, CMP, lipase, CT, EKG, chest x-ray were all done and the only abnormal findings were elevated liver enzymes. Patient does not have a gallbladder. Patient is not able to eat anything without vomiting. She can drink a little bit of water. Zofran is not helping. In the emergency room gallbladder was given which did help with pain. She was discharged with Bentyl which has not helped at all. No recent overuse of NSAID.   Patient denies any fever, chills, dysuria or flank pain. Denies any melena or hematochezia   CT ABDOMEN PELVIS W CONTRAST  FINDINGS:  NO gallbladder is visualized (s/p cholecystectomy). NO acute abnormality in the liver, pancreas, spleen, or adrenal glands.  NO acute abnormality of the GI tract (no obstruction, colitis/enteritis etc.). NO pneumatosis, pneumoperitoneum, or hemoperitoneum.  NO appendicitis (the appendix is well seen).  NO acute abnormality of the kidneys (no urolithiasis, mass, hydronephrosis etc.). NO abnormality of the urinary bladder.  Small simple/benign appearing left adnexal cyst noted; in this age group, this most likely represents the normal ovarian dominant follicle or a benign ovarian follicular cyst. Otherwise, the uterus and adnexa are negative acute.  NO acute osseous lesion.  Impression:  IMPRESSION:  Negative acute.    Review of Systems See HPI.     Objective:   Physical Exam  Constitutional: She appears well-developed and well-nourished.  Obese.   HENT:  Head:  Normocephalic and atraumatic.  Cardiovascular: Normal rate, regular rhythm and normal heart sounds.   Pulmonary/Chest: Effort normal and breath sounds normal. She has no wheezes.  Tenderness over right CVA.   Abdominal: Soft. Bowel sounds are normal.  Guarding and moderate tenderness over LUQ, epigastric, RUQ.   Psychiatric: She has a normal mood and affect. Her behavior is normal.          Assessment & Plan:  Epigastric tenderness with radiation to back/n/v/d- Explained to patient very reassuring that CT scan was normal. There were some elevated liver enzymes which we can follow-up in the next week or so. I'm very suspicious for h pylori. Causing peptic ulcer. Due to rapid onset of symptoms. Breath test was done in clinic today. Patient was told to avoid all anti-inflammatories. She was given a GI cocktail after completing breath test. We'll send home with PPI and Carafate. Continue zofran for nause. Hopefully this will help with her symptoms. Discussed bland diet for the next 3-5 days. Follow-up if symptoms worsening. Will give some tramadol for pain.

## 2015-09-20 NOTE — Patient Instructions (Signed)
Follow up in 4 weeks with PCP or myself.   Peptic Ulcer A peptic ulcer is a sore in the lining of your esophagus (esophageal ulcer), stomach (gastric ulcer), or in the first part of your small intestine (duodenal ulcer). The ulcer causes erosion into the deeper tissue. CAUSES  Normally, the lining of the stomach and the small intestine protects itself from the acid that digests food. The protective lining can be damaged by:  An infection caused by a bacterium called Helicobacter pylori (H. pylori).  Regular use of nonsteroidal anti-inflammatory drugs (NSAIDs), such as ibuprofen or aspirin.  Smoking tobacco. Other risk factors include being older than 50, drinking alcohol excessively, and having a family history of ulcer disease.  SYMPTOMS   Burning pain or gnawing in the area between the chest and the belly button.  Heartburn.  Nausea and vomiting.  Bloating. The pain can be worse on an empty stomach and at night. If the ulcer results in bleeding, it can cause:  Black, tarry stools.  Vomiting of bright red blood.  Vomiting of coffee-ground-looking materials. DIAGNOSIS  A diagnosis is usually made based upon your history and an exam. Other tests and procedures may be performed to find the cause of the ulcer. Finding a cause will help determine the best treatment. Tests and procedures may include:  Blood tests, stool tests, or breath tests to check for the bacterium H. pylori.  An upper gastrointestinal (GI) series of the esophagus, stomach, and small intestine.  An endoscopy to examine the esophagus, stomach, and small intestine.  A biopsy. TREATMENT  Treatment may include:  Eliminating the cause of the ulcer, such as smoking, NSAIDs, or alcohol.  Medicines to reduce the amount of acid in your digestive tract.  Antibiotic medicines if the ulcer is caused by the H. pylori bacterium.  An upper endoscopy to treat a bleeding ulcer.  Surgery if the bleeding is severe or if  the ulcer created a hole somewhere in the digestive system. HOME CARE INSTRUCTIONS   Avoid tobacco, alcohol, and caffeine. Smoking can increase the acid in the stomach, and continued smoking will impair the healing of ulcers.  Avoid foods and drinks that seem to cause discomfort or aggravate your ulcer.  Only take medicines as directed by your caregiver. Do not substitute over-the-counter medicines for prescription medicines without talking to your caregiver.  Keep any follow-up appointments and tests as directed. SEEK MEDICAL CARE IF:   Your do not improve within 7 days of starting treatment.  You have ongoing indigestion or heartburn. SEEK IMMEDIATE MEDICAL CARE IF:   You have sudden, sharp, or persistent abdominal pain.  You have bloody or dark black, tarry stools.  You vomit blood or vomit that looks like coffee grounds.  You become light-headed, weak, or feel faint.  You become sweaty or clammy. MAKE SURE YOU:   Understand these instructions.  Will watch your condition.  Will get help right away if you are not doing well or get worse.   This information is not intended to replace advice given to you by your health care provider. Make sure you discuss any questions you have with your health care provider.   Document Released: 03/29/2000 Document Revised: 04/22/2014 Document Reviewed: 10/30/2011 Elsevier Interactive Patient Education [employer]2016 Elsevier Inc.

## 2015-09-22 LAB — H. PYLORI BREATH TEST: H. pylori Breath Test: NOT DETECTED

## 2015-09-22 MED ORDER — HYDROCODONE-ACETAMINOPHEN 5-325 MG PO TABS: 1.0000 | ORAL_TABLET | Freq: Three times a day (TID) | ORAL | Status: DC | PRN

## 2015-09-22 NOTE — Addendum Note (Signed)
Addended by: Jomarie LongsBREEBACK, JADE L on: 09/22/2015 12:45 PM   Modules accepted: Orders

## 2015-09-29 ENCOUNTER — Ambulatory Visit (INDEPENDENT_AMBULATORY_CARE_PROVIDER_SITE_OTHER): Payer: BLUE CROSS/BLUE SHIELD | Admitting: Physician Assistant

## 2015-09-29 ENCOUNTER — Encounter: Payer: Self-pay | Admitting: Physician Assistant

## 2015-09-29 VITALS — BP 114/67 | HR 61 | Ht 63.0 in | Wt 242.0 lb

## 2015-09-29 DIAGNOSIS — K859 Acute pancreatitis without necrosis or infection, unspecified: Secondary | ICD-10-CM

## 2015-09-29 DIAGNOSIS — K8050 Calculus of bile duct without cholangitis or cholecystitis without obstruction: Secondary | ICD-10-CM | POA: Insufficient documentation

## 2015-09-29 DIAGNOSIS — R748 Abnormal levels of other serum enzymes: Secondary | ICD-10-CM | POA: Diagnosis not present

## 2015-09-29 DIAGNOSIS — Z8719 Personal history of other diseases of the digestive system: Secondary | ICD-10-CM | POA: Insufficient documentation

## 2015-09-29 LAB — COMPLETE METABOLIC PANEL WITH GFR
ALBUMIN: 4.3 g/dL (ref 3.6–5.1)
ALK PHOS: 95 U/L (ref 33–115)
ALT: 117 U/L — ABNORMAL HIGH (ref 6–29)
AST: 31 U/L — AB (ref 10–30)
BUN: 8 mg/dL (ref 7–25)
CALCIUM: 10.2 mg/dL (ref 8.6–10.2)
CO2: 26 mmol/L (ref 20–31)
Chloride: 105 mmol/L (ref 98–110)
Creat: 0.84 mg/dL (ref 0.50–1.10)
Glucose, Bld: 91 mg/dL (ref 65–99)
POTASSIUM: 4.5 mmol/L (ref 3.5–5.3)
Sodium: 136 mmol/L (ref 135–146)
Total Bilirubin: 0.9 mg/dL (ref 0.2–1.2)
Total Protein: 7.1 g/dL (ref 6.1–8.1)

## 2015-09-29 LAB — CBC WITH DIFFERENTIAL/PLATELET
BASOS ABS: 0 {cells}/uL (ref 0–200)
Basophils Relative: 0 %
EOS PCT: 2 %
Eosinophils Absolute: 166 cells/uL (ref 15–500)
HCT: 41.2 % (ref 35.0–45.0)
Hemoglobin: 14 g/dL (ref 11.7–15.5)
Lymphocytes Relative: 30 %
Lymphs Abs: 2490 cells/uL (ref 850–3900)
MCH: 28.5 pg (ref 27.0–33.0)
MCHC: 34 g/dL (ref 32.0–36.0)
MCV: 83.7 fL (ref 80.0–100.0)
MONOS PCT: 9 %
MPV: 12 fL (ref 7.5–12.5)
Monocytes Absolute: 747 cells/uL (ref 200–950)
NEUTROS ABS: 4897 {cells}/uL (ref 1500–7800)
Neutrophils Relative %: 59 %
PLATELETS: 236 10*3/uL (ref 140–400)
RBC: 4.92 MIL/uL (ref 3.80–5.10)
RDW: 14.9 % (ref 11.0–15.0)
WBC: 8.3 10*3/uL (ref 3.8–10.8)

## 2015-09-29 LAB — LIPASE: LIPASE: 41 U/L (ref 7–60)

## 2015-09-29 NOTE — Progress Notes (Addendum)
   Subjective:    Patient ID: Julia [name], female    DOB: 03/10/1986, 30 y.o.   MRN: [id number]  HPI Pt is a 30 yo female who presents to the clinic for follow up after ERCP and [facility] of stone [facility]/15. Her ALT was 233 and AST was 82, lipase 442, WBC was 3.3 and hgB 12.9. She is feeling much better today. She has no pain. Bowel movements are good and normal. She is eating a bland diet. Pt is not taking any medication for pain.    Review of Systems  All other systems reviewed and are negative.      Objective:   Physical Exam  Constitutional: She is oriented to person, place, and time. She appears well-developed and well-nourished.  HENT:  Head: Normocephalic and atraumatic.  Cardiovascular: Normal rate, regular rhythm and normal heart sounds.   Pulmonary/Chest: Effort normal and breath sounds normal.  No CVA tenderness.   Abdominal: Soft. Bowel sounds are normal. She exhibits no distension and no mass. There is no tenderness. There is no rebound and no guarding.  Neurological: She is alert and oriented to person, place, and time.  Psychiatric: She has a normal mood and affect. Her behavior is normal.          Assessment & Plan:  Choledocholithiasis/acute pancreatitis- pt doing well. Recheck CBC, CMP, lipase. Will make sure labs are improving and her diet can advance.

## 2015-10-20 ENCOUNTER — Emergency Department
Admission: EM | Admit: 2015-10-20 | Discharge: 2015-10-20 | Disposition: A | Discharge status: Home / Self Care | Payer: BLUE CROSS/BLUE SHIELD | Source: Home / Self Care | Attending: Emergency Medicine | Admitting: Emergency Medicine

## 2015-10-20 ENCOUNTER — Encounter: Payer: Self-pay | Admitting: Emergency Medicine

## 2015-10-20 DIAGNOSIS — I889 Nonspecific lymphadenitis, unspecified: Secondary | ICD-10-CM | POA: Diagnosis not present

## 2015-10-20 DIAGNOSIS — H109 Unspecified conjunctivitis: Secondary | ICD-10-CM | POA: Diagnosis not present

## 2015-10-20 DIAGNOSIS — J029 Acute pharyngitis, unspecified: Secondary | ICD-10-CM

## 2015-10-20 LAB — POCT RAPID STREP A (OFFICE): Rapid Strep A Screen: NEGATIVE

## 2015-10-20 MED ORDER — AMOXICILLIN 875 MG PO TABS: ORAL_TABLET | ORAL | Status: DC

## 2015-10-20 MED ORDER — POLYMYXIN B-TRIMETHOPRIM 10000-0.1 UNIT/ML-% OP SOLN: OPHTHALMIC | Status: DC

## 2015-10-20 NOTE — ED Provider Notes (Signed)
CSN: [id number]     Arrival date & time 10/20/15  0806 History   First MD Initiated Contact with Patient 10/20/15 [phone]     Chief Complaint  Patient presents with  . Sore Throat   (Consider location/radiation/quality/duration/timing/severity/associated sxs/prior Treatment) HPI URI HISTORY  Julia [name] is a 30 y.o. female who complains of Worsening sore throat for 5 days with low-grade fever and 2 days of red irritated right eye with crusty yellow-green drainage. No chills/sweats  No significant  Nasal congestion No  Discolored Post-nasal drainage No sinus pain/pressure Positive sore throat  No  cough No wheezing No chest congestion No hemoptysis No shortness of breath No pleuritic pain  Denies seasonal allergy symptoms No earache  No nausea No vomiting No abdominal pain No diarrhea  No skin rashes +  Fatigue No myalgias No headache   Past Medical History  Diagnosis Date  . Acne    Past Surgical History  Procedure Laterality Date  . Cholecystectomy  12/2008  . Tonsillectomy     Family History  Problem Relation Age of Onset  . Alcohol abuse Father   . Alcohol abuse Maternal Grandmother   . Asthma Mother   . Hypertension Mother    Social History  Substance Use Topics  . Smoking status: Never Smoker   . Smokeless tobacco: Never Used  . Alcohol Use: Yes     Comment: 3 x monthly   OB History    Gravida Para Term Preterm AB TAB SAB Ectopic Multiple Living   0 0 0 0 0 0 0 0 0 0      Review of Systems  All other systems reviewed and are negative.   Allergies  Morphine and related  Home Medications   Prior to Admission medications   Medication Sig Start Date End Date Taking? Authorizing Provider  amoxicillin (AMOXIL) 875 MG tablet Take 1 twice a day X 10 days. 10/20/15   Lajean Manesavid Massey, MD  trimethoprim-polymyxin b (POLYTRIM) ophthalmic solution 2 drop in affected eye(s) every 4 hours (while awake) x 7 days 10/20/15   Lajean Manesavid Massey, MD   Meds Ordered and  Administered this Visit  Medications - No data to display  BP 128/89 mmHg  Pulse 76  Temp(Src) 97.8 F (36.6 C) (Oral)  Wt 242 lb (109.77 kg)  SpO2 98% No data found.   Physical Exam  Constitutional: She is oriented to person, place, and time. She appears well-developed and well-nourished. She is cooperative.  Non-toxic appearance. No distress.  HENT:  Head: Normocephalic and atraumatic. Head is without right periorbital erythema and without left periorbital erythema.  Right Ear: Tympanic membrane, external ear and ear canal normal.  Left Ear: Tympanic membrane, external ear and ear canal normal.  Nose: Nose normal. Right sinus exhibits no maxillary sinus tenderness and no frontal sinus tenderness. Left sinus exhibits no maxillary sinus tenderness and no frontal sinus tenderness.  Mouth/Throat: Mucous membranes are normal. No oral lesions. Posterior oropharyngeal erythema present. No oropharyngeal exudate or posterior oropharyngeal edema.  Eyes: EOM are normal. Pupils are equal, round, and reactive to light. Right eye exhibits discharge. Right eye exhibits no hordeolum. No foreign body present in the right eye. Left eye exhibits no discharge and no hordeolum. No foreign body present in the left eye. Right conjunctiva has no hemorrhage. Left conjunctiva has no hemorrhage. No scleral icterus.  Right conjunctiva very red, injected. No foreign body. No sign of corneal abrasion  Neck: Neck supple.  Cardiovascular: Normal rate, regular rhythm and normal heart  sounds.   No murmur heard. Pulmonary/Chest: Effort normal and breath sounds normal. No stridor. No respiratory distress. She has no wheezes. She has no rales.  Musculoskeletal: She exhibits no edema.  Lymphadenopathy:    She has cervical adenopathy.       Right cervical: Superficial cervical adenopathy present. No deep cervical and no posterior cervical adenopathy present.      Left cervical: Superficial cervical adenopathy present. No  deep cervical and no posterior cervical adenopathy present.  Neurological: She is alert and oriented to person, place, and time.  Skin: Skin is warm and dry. No rash noted.  Psychiatric: She has a normal mood and affect.  Nursing note and vitals reviewed.   ED Course  Procedures (including critical care time)  Labs Review Labs Reviewed  POCT RAPID STREP A (OFFICE)    Imaging Review No results found.    MDM   1. Acute pharyngitis, unspecified etiology   2. Right conjunctivitis   3. Cervical adenitis    Rapid strep test negative  Treatment options discussed, as well as risks, benefits, alternatives. Patient voiced understanding and agreement with the following plans: Discharge Medication List as of 10/20/2015  8:53 AM    START taking these medications   Details  amoxicillin (AMOXIL) 875 MG tablet Take 1 twice a day X 10 days., Normal    trimethoprim-polymyxin b (POLYTRIM) ophthalmic solution 2 drop in affected eye(s) every 4 hours (while awake) x 7 days, Normal       Other symptomatic care discussed Precautions discussed. Red flags discussed. Questions invited and answered. Patient voiced understanding and agreement. Follow-up with PCP if no better in 5-7 days, sooner if worse or new symptoms    Lajean Manesavid Massey, MD 10/20/15 [phone]

## 2015-10-20 NOTE — ED Notes (Signed)
Pt c/o sore throat x5 days. Denies fever. Right eye redness and itching that started yesterday.

## 2015-10-22 ENCOUNTER — Telehealth: Payer: Self-pay | Admitting: Emergency Medicine

## 2015-10-22 NOTE — ED Notes (Signed)
LMTC.  Advised if doing well, no need to return call, any questions or concerns, contact the office.  TMartin,CMA

## 2015-11-06 ENCOUNTER — Encounter: Payer: Self-pay | Admitting: Obstetrics & Gynecology

## 2015-11-06 ENCOUNTER — Ambulatory Visit (INDEPENDENT_AMBULATORY_CARE_PROVIDER_SITE_OTHER): Payer: BLUE CROSS/BLUE SHIELD | Admitting: Obstetrics & Gynecology

## 2015-11-06 VITALS — BP 120/82 | HR 72 | Ht 63.0 in | Wt 244.0 lb

## 2015-11-06 DIAGNOSIS — Z124 Encounter for screening for malignant neoplasm of cervix: Secondary | ICD-10-CM | POA: Diagnosis not present

## 2015-11-06 DIAGNOSIS — N63 Unspecified lump in unspecified breast: Secondary | ICD-10-CM

## 2015-11-06 DIAGNOSIS — Z113 Encounter for screening for infections with a predominantly sexual mode of transmission: Secondary | ICD-10-CM | POA: Diagnosis not present

## 2015-11-06 DIAGNOSIS — Z1151 Encounter for screening for human papillomavirus (HPV): Secondary | ICD-10-CM | POA: Diagnosis not present

## 2015-11-06 DIAGNOSIS — Z01419 Encounter for gynecological examination (general) (routine) without abnormal findings: Secondary | ICD-10-CM | POA: Diagnosis not present

## 2015-11-06 NOTE — Patient Instructions (Signed)
Preparing for Pregnancy Before trying to become pregnant, make an appointment with your health care provider (preconception care). The goal is to help you have a healthy, safe pregnancy. At your first appointment, your health care provider will:   Do a complete physical exam, including a Pap test.  Take a complete medical history.  Give you advice and help you resolve any problems. PRECONCEPTION CHECKLIST Here is a list of the basics to cover with your health care provider at your preconception visit:  Medical history.  Tell your health care provider about any diseases you have had. Many diseases can affect your pregnancy.  Include your partner's medical history and family history.  Make sure you have been tested for sexually transmitted infections (STIs). These can affect your pregnancy. In some cases, they can be passed to your baby. Tell your health care provider about any history of STIs.  Make sure your health care provider knows about any previous problems you have had with conception or pregnancy.  Tell your health care provider about any medicine you take. This includes herbal supplements and over-the-counter medicines.  Make sure all your immunizations are up to date. You may need to make additional appointments.  Ask your health care provider if you need any vaccinations or if there are any you should avoid.  Diet.  It is especially important to eat a healthy, balanced diet with the right nutrients when you are pregnant.  Ask your health care provider to help you get to a healthy weight before pregnancy.  If you are overweight, you are at higher risk for certain complications. These include high blood pressure, diabetes, and preterm birth.  If you are underweight, you are more likely to have a low-birth-weight baby.  Lifestyle.  Tell your health care provider about lifestyle factors such as alcohol use, drug use, or smoking.  Describe any harmful substances you may  be exposed to at work or home. These can include chemicals, pesticides, and radiation.  Mental health.  Let your health care provider know if you have been feeling depressed or anxious.  Let your health care provider know if you have a history of substance abuse.  Let your health care provider know if you do not feel safe at home. HOME INSTRUCTIONS TO PREPARE FOR PREGNANCY Follow your health care provider's advice and instructions.   Keep an accurate record of your menstrual periods. This makes it easier for your health care provider to determine your baby's due date.  Begin taking prenatal vitamins and folic acid supplements daily. Take them as directed by your health care provider.  Eat a balanced diet. Get help from a nutrition counselor if you have questions or need help.  Get regular exercise. Try to be active for at least 30 minutes a day most days of the week.  Quit smoking, if you smoke.  Do not drink alcohol.  Do not take illegal drugs.  Get medical problems, such as diabetes or high blood pressure, under control.  If you have diabetes, make sure you do the following:  Have good blood sugar control. If you have type 1 diabetes, use multiple daily doses of insulin. Do not use split-dose or premixed insulin.  Have an eye exam by a qualified eye care professional trained in caring for people with diabetes.  Get evaluated by your health care provider for cardiovascular disease.  Get to a healthy weight. If you are overweight or obese, reduce your weight with the help of a qualified health   professional such as a registered dietitian. Ask your health care provider what the right weight range is for you. HOW DO I KNOW I AM PREGNANT? You may be pregnant if you have been sexually active and you miss your period. Symptoms of early pregnancy include:   Mild cramping.  Very light vaginal bleeding (spotting).  Feeling unusually tired.  Morning sickness. If you have any of  these symptoms, take a home pregnancy test. These tests look for a hormone called human chorionic gonadotropin (hCG) in your urine. Your body begins to make this hormone during early pregnancy. These tests are very accurate. Wait until at least the first day you miss your period to take one. If you get a positive result, call your health care provider to make appointments for prenatal care. WHAT SHOULD I DO IF I BECOME PREGNANT?  Make an appointment with your health care provider by week 12 of your pregnancy at the latest.  Do not smoke. Smoking can be harmful to your baby.  Do not drink alcoholic beverages. Alcohol is related to a number of birth defects.  Avoid toxic odors and chemicals.  You may continue to have sexual intercourse if it does not cause pain or other problems, such as vaginal bleeding.   This information is not intended to replace advice given to you by your health care provider. Make sure you discuss any questions you have with your health care provider.   Document Released: 03/14/2008 Document Revised: 04/22/2014 Document Reviewed: 03/08/2013 Elsevier Interactive Patient Education 2016 [employer] Inc[employer]  

## 2015-11-06 NOTE — Progress Notes (Signed)
Subjective:     Julia [name] is a 30 y.o. female here for a routine exam.  Current complaints: trying to conceive.  Wants to know if she is ) negative.  Has lsot 20 pounds in the past 4 months.  C/o right breast lump that has grown.   Gynecologic History Patient's last menstrual period was 10/30/2015 (approximate). Contraception: none Last Pap: 2015. Results were: normal   Obstetric History OB History  Gravida Para Term Preterm AB Living  0 0 0 0 0 0  SAB TAB Ectopic Multiple Live Births  0 0 0 0           The following portions of the patient's history were reviewed and updated as appropriate: allergies, current medications, past family history, past medical history, past social history, past surgical history and problem list.  Review of Systems Pertinent items noted in HPI and remainder of comprehensive ROS otherwise negative.    Objective:      Vitals:   11/06/15 1330  BP: 120/82  Pulse: 72  Weight: 244 lb (110.7 kg)  Height: 5\' 3"  (1.6 m)   Vitals:  WNL General appearance: alert, cooperative and no distress  HEENT: Normocephalic, without obvious abnormality, atraumatic Eyes: negative Throat: lips, mucosa, and tongue normal; teeth and gums normal  Respiratory: Clear to auscultation bilaterally  CV: Regular rate and rhythm  Breasts:  Normal appearance, no masses or tenderness, no nipple retraction or dimpling.  Area of concern for patient is 2 0'clck right outer breast.  ? Denser normal breast tissue.  GI: Soft, non-tender; bowel sounds normal; no masses,  no organomegaly  GU: External Genitalia:  Tanner V, no lesion Urethra:  No prolapse   Vagina: Pink, normal rugae, no blood or discharge  Cervix: No CMT, no lesion  Uterus:  Normal size and contour, non tender, limited by habitus  Adnexa: Normal, no masses, non tender, limited by habitus  Musculoskeletal: No edema, redness or tenderness in the calves or thighs  Skin: No lesions or rash  Lymphatic: Axillary  adenopathy: none    Psychiatric: Normal mood and behavior     Assessment:    Healthy female exam,.   Self reported right breast mass that has grown over 2 years    Plan:    Education reviewed: weight bearing exercise. Contraception: none. Follow up in: 1 year. right breast US for sleft reproted breast mass, not felt well on wxam.  Weight loss Prenatal vitamins

## 2015-11-07 ENCOUNTER — Telehealth: Payer: Self-pay | Admitting: *Deleted

## 2015-11-07 LAB — ABO AND RH: RH TYPE: POSITIVE

## 2015-11-07 NOTE — Telephone Encounter (Signed)
Pt notified of ABO which is O pos

## 2015-11-08 LAB — CYTOLOGY - PAP

## 2015-11-13 ENCOUNTER — Ambulatory Visit
Admission: RE | Admit: 2015-11-13 | Discharge: 2015-11-13 | Disposition: A | Discharge status: Home / Self Care | Payer: BLUE CROSS/BLUE SHIELD | Source: Ambulatory Visit | Attending: Obstetrics & Gynecology | Admitting: Obstetrics & Gynecology

## 2015-11-13 DIAGNOSIS — N63 Unspecified lump in unspecified breast: Secondary | ICD-10-CM

## 2015-11-13 DIAGNOSIS — Z01419 Encounter for gynecological examination (general) (routine) without abnormal findings: Secondary | ICD-10-CM

## 2016-05-13 ENCOUNTER — Ambulatory Visit (INDEPENDENT_AMBULATORY_CARE_PROVIDER_SITE_OTHER): Payer: BLUE CROSS/BLUE SHIELD

## 2016-05-13 ENCOUNTER — Encounter: Payer: Self-pay | Admitting: Sports Medicine

## 2016-05-13 ENCOUNTER — Ambulatory Visit (INDEPENDENT_AMBULATORY_CARE_PROVIDER_SITE_OTHER): Payer: BLUE CROSS/BLUE SHIELD | Admitting: Sports Medicine

## 2016-05-13 VITALS — BP 120/81 | HR 96 | Temp 98.3°F | Wt 207.0 lb

## 2016-05-13 DIAGNOSIS — S67194A Crushing injury of right ring finger, initial encounter: Secondary | ICD-10-CM | POA: Insufficient documentation

## 2016-05-13 DIAGNOSIS — R1013 Epigastric pain: Secondary | ICD-10-CM | POA: Diagnosis not present

## 2016-05-13 DIAGNOSIS — W230XXA Caught, crushed, jammed, or pinched between moving objects, initial encounter: Secondary | ICD-10-CM | POA: Diagnosis not present

## 2016-05-13 DIAGNOSIS — R8299 Other abnormal findings in urine: Secondary | ICD-10-CM | POA: Diagnosis not present

## 2016-05-13 DIAGNOSIS — R82998 Other abnormal findings in urine: Secondary | ICD-10-CM

## 2016-05-13 DIAGNOSIS — R109 Unspecified abdominal pain: Secondary | ICD-10-CM | POA: Insufficient documentation

## 2016-05-13 LAB — POCT URINALYSIS DIPSTICK
Bilirubin, UA: NEGATIVE
Blood, UA: NEGATIVE
Glucose, UA: NEGATIVE
Ketones, UA: NEGATIVE
Nitrite, UA: NEGATIVE
Protein, UA: NEGATIVE
Spec Grav, UA: 1.02
Urobilinogen, UA: 0.2
pH, UA: 7

## 2016-05-13 LAB — POCT URINE PREGNANCY: Preg Test, Ur: NEGATIVE

## 2016-05-13 MED ORDER — MELOXICAM 15 MG PO TABS: ORAL_TABLET | ORAL | 3 refills | Status: DC

## 2016-05-13 MED ORDER — CEPHALEXIN 500 MG PO CAPS: 500.0000 mg | ORAL_CAPSULE | Freq: Two times a day (BID) | ORAL | 0 refills | Status: DC

## 2016-05-13 NOTE — Assessment & Plan Note (Signed)
Stax splint placed, x-rays, suspect tuft fracture. After 4-6 weeks of immobilization she will need some hand therapy to regain motion. Meloxicam for pain.

## 2016-05-13 NOTE — Assessment & Plan Note (Signed)
Suspect cystitis with small of leukocytes on the urinalysis, we are going to culture this urine.  She did have a negative pregnancy test. History of choledocholithiasis with pancreatitis necessitating ERCP, adding CBC, CMP, amylase, lipase. Adding Keflex for urinary tract infection. Return to see me in 2 weeks.

## 2016-05-13 NOTE — Progress Notes (Signed)
   Subjective:    I'm seeing this patient as a consultation for:  Dr. Nani Gasseratherine Metheney  CC: Finger injury  HPI: Recently this pleasant 31 year old female crashed her right ring finger between 2 objects, she had immediate pain, swelling, bruising, she has inability to flex at the distal interphalangeal joint, this concerned her sure she came in.  She also has new once abdominal pain, suprapubic and right lumbar. No dysuria, urgency, frequency, she is concerned that she may be pregnant, she does have a history of cholelithiasis with choledocholithiasis necessitating ERCP for stone retrieval.  Past medical history:  Negative.  See flowsheet/record as well for more information.  Surgical history: Negative.  See flowsheet/record as well for more information.  Family history: Negative.  See flowsheet/record as well for more information.  Social history: Negative.  See flowsheet/record as well for more information.  Allergies, and medications have been entered into the medical record, reviewed, and no changes needed.   Review of Systems: No headache, visual changes, nausea, vomiting, diarrhea, constipation, dizziness, abdominal pain, skin rash, fevers, chills, night sweats, weight loss, swollen lymph nodes, body aches, joint swelling, muscle aches, chest pain, shortness of breath, mood changes, visual or auditory hallucinations.   Objective:   General: Well Developed, well nourished, and in no acute distress.  Neuro/Psych: Alert and oriented x3, extra-ocular muscles intact, able to move all 4 extremities, sensation grossly intact. Skin: Warm and dry, no rashes noted.  Respiratory: Not using accessory muscles, speaking in full sentences, trachea midline.  Cardiovascular: Pulses palpable, no extremity edema. Abdomen: Soft, tender to palpation in the suprapubic region, right lumbar region. No costovertebral angle pain. No guarding, rigidity, rebound tenderness.. Right hand: Ring finger is swollen  at the distal phalanx, bruised, I'm able to passively flex at the DIP but she is unable to do this actively. Seemingly mostly from swelling and pain  Urinalysis reviewed and is positive for small leukocytes, urine pregnancy test is negative.  Impression and Recommendations:   This case required medical decision making of moderate complexity.  Crushing injury of right ring finger Stax splint placed, x-rays, suspect tuft fracture. After 4-6 weeks of immobilization she will need some hand therapy to regain motion. Meloxicam for pain.  Abdominal pain Suspect cystitis with small of leukocytes on the urinalysis, we are going to culture this urine.  She did have a negative pregnancy test. History of choledocholithiasis with pancreatitis necessitating ERCP, adding CBC, CMP, amylase, lipase. Adding Keflex for urinary tract infection. Return to see me in 2 weeks.

## 2016-05-14 LAB — COMPREHENSIVE METABOLIC PANEL
Albumin: 4.1 g/dL (ref 3.6–5.1)
BUN: 10 mg/dL (ref 7–25)
CO2: 25 mmol/L (ref 20–31)
Calcium: 9.3 mg/dL (ref 8.6–10.2)
Chloride: 109 mmol/L (ref 98–110)
Creat: 0.7 mg/dL (ref 0.50–1.10)
Potassium: 4.4 mmol/L (ref 3.5–5.3)
Sodium: 141 mmol/L (ref 135–146)
Total Protein: 6.8 g/dL (ref 6.1–8.1)

## 2016-05-14 LAB — CBC WITH DIFFERENTIAL/PLATELET
Basophils Absolute: 0 cells/uL (ref 0–200)
Basophils Relative: 0 %
Eosinophils Absolute: 138 cells/uL (ref 15–500)
Eosinophils Relative: 2 %
HCT: 40.8 % (ref 35.0–45.0)
Hemoglobin: 13.5 g/dL (ref 11.7–15.5)
Lymphocytes Relative: 27 %
Lymphs Abs: 1863 {cells}/uL (ref 850–3900)
MCH: 28.9 pg (ref 27.0–33.0)
MCHC: 33.1 g/dL (ref 32.0–36.0)
MCV: 87.4 fL (ref 80.0–100.0)
MPV: 12.8 fL — ABNORMAL HIGH (ref 7.5–12.5)
Monocytes Absolute: 759 cells/uL (ref 200–950)
Monocytes Relative: 11 %
Neutro Abs: 4140 cells/uL (ref 1500–7800)
Neutrophils Relative %: 60 %
Platelets: 220 10*3/uL (ref 140–400)
RBC: 4.67 MIL/uL (ref 3.80–5.10)
RDW: 13.9 % (ref 11.0–15.0)
WBC: 6.9 K/uL (ref 3.8–10.8)

## 2016-05-14 LAB — COMPREHENSIVE METABOLIC PANEL WITH GFR
ALT: 13 U/L (ref 6–29)
AST: 12 U/L (ref 10–30)
Alkaline Phosphatase: 46 U/L (ref 33–115)
Glucose, Bld: 78 mg/dL (ref 65–99)
Total Bilirubin: 0.4 mg/dL (ref 0.2–1.2)

## 2016-05-14 LAB — AMYLASE: Amylase: 40 U/L (ref 0–105)

## 2016-05-14 LAB — LIPASE: Lipase: 23 U/L (ref 7–60)

## 2016-05-15 LAB — URINE CULTURE: Organism ID, Bacteria: NO GROWTH

## 2016-05-30 ENCOUNTER — Ambulatory Visit: Payer: BLUE CROSS/BLUE SHIELD | Admitting: Sports Medicine

## 2016-07-25 ENCOUNTER — Ambulatory Visit: Payer: BLUE CROSS/BLUE SHIELD | Admitting: Obstetrics & Gynecology

## 2016-07-25 DIAGNOSIS — B373 Candidiasis of vulva and vagina: Secondary | ICD-10-CM

## 2017-06-24 DIAGNOSIS — E669 Obesity, unspecified: Secondary | ICD-10-CM | POA: Diagnosis not present

## 2017-06-24 DIAGNOSIS — Z6836 Body mass index (BMI) 36.0-36.9, adult: Secondary | ICD-10-CM | POA: Diagnosis not present

## 2017-07-30 DIAGNOSIS — Z6836 Body mass index (BMI) 36.0-36.9, adult: Secondary | ICD-10-CM | POA: Diagnosis not present

## 2017-07-30 DIAGNOSIS — E669 Obesity, unspecified: Secondary | ICD-10-CM | POA: Diagnosis not present

## 2017-08-27 DIAGNOSIS — Z6835 Body mass index (BMI) 35.0-35.9, adult: Secondary | ICD-10-CM | POA: Diagnosis not present

## 2017-08-27 DIAGNOSIS — E6609 Other obesity due to excess calories: Secondary | ICD-10-CM | POA: Diagnosis not present

## 2017-09-10 ENCOUNTER — Encounter: Payer: Self-pay | Admitting: Family Medicine

## 2017-09-10 ENCOUNTER — Ambulatory Visit: Payer: BLUE CROSS/BLUE SHIELD | Admitting: Family Medicine

## 2017-09-10 VITALS — BP 129/77 | HR 99 | Ht 63.0 in | Wt 206.0 lb

## 2017-09-10 DIAGNOSIS — S46811A Strain of other muscles, fascia and tendons at shoulder and upper arm level, right arm, initial encounter: Secondary | ICD-10-CM | POA: Diagnosis not present

## 2017-09-10 DIAGNOSIS — R21 Rash and other nonspecific skin eruption: Secondary | ICD-10-CM

## 2017-09-10 DIAGNOSIS — L689 Hypertrichosis, unspecified: Secondary | ICD-10-CM | POA: Diagnosis not present

## 2017-09-10 NOTE — Progress Notes (Signed)
Subjective:    Patient ID: Julia [name], female    DOB: 10-15-1985, 32 y.o.   MRN: [id number]  HPI Rash - x 3 days under neck. she reports that the area is sensitive, she stated that it has been really hot at work and felt like it was a combination of sweat and make up so she scrubbed the area with soap and water and applied lotion.  No new soaps, lotions, etc.      She also complains of pain on the right side of her neck traveling from just behind her ear down to her right shoulder.  Is been going on for about 3 weeks and really bothering her.  3 or trauma.  She also request to have her testosterone levels checked.  She is noticed that she gets occasional hairs on her chest.  She has excess growth on her face that she plucks and she has significant acne around her chin and neck.  She says that she is not sure if she is had fertility issues or not but did not use protection for years and did not get pregnant.  She is never been diagnosed with PCOS that she knows of.    Review of Systems  Ht  (1.6 m)   BMI 36.67 kg/m     Allergies  Allergen Reactions  . Latex   . Morphine And Related     Grandfather died from morphine    Past Medical History:  Diagnosis Date  . Acne     Past Surgical History:  Procedure Laterality Date  . CHOLECYSTECTOMY  12/2008  . gallstone removal    . TONSILLECTOMY      Social History   Socioeconomic History  . Marital status: Single    Spouse name: Not on file  . Number of children: Not on file  . Years of education: HS  . Highest education level: Not on file  Occupational History  . Occupation: shipping/Receiving clear    Comment: [employer]  Social Needs  . Financial resource strain: Not on file  . Food insecurity:    Worry: Not on file    Inability: Not on file  . Transportation needs:    Medical: Not on file    Non-medical: Not on file  Tobacco Use  . Smoking status: Never Smoker  . Smokeless tobacco: Never Used  Substance and  Sexual Activity  . Alcohol use: Yes    Comment: 3 x monthly  . Drug use: No  . Sexual activity: Yes    Partners: Male  Lifestyle  . Physical activity:    Days per week: Not on file    Minutes per session: Not on file  . Stress: Not on file  Relationships  . Social connections:    Talks on phone: Not on file    Gets together: Not on file    Attends religious service: Not on file    Active member of club or organization: Not on file    Attends meetings of clubs or organizations: Not on file    Relationship status: Not on file  . Intimate partner violence:    Fear of current or ex partner: Not on file    Emotionally abused: Not on file    Physically abused: Not on file    Forced sexual activity: Not on file  Other Topics Concern  . Not on file  Social History Narrative   2 bottles of soda per day.  No regular exericise.  Lives with her mother and brother.    Family History  Problem Relation Age of Onset  . Asthma Mother   . Hypertension Mother   . Alcohol abuse Father   . Alcohol abuse Maternal Grandmother     Outpatient Encounter Medications as of 09/10/2017  Medication Sig  . Levonorgestrel (KYLEENA) 19.5 MG IUD by Intrauterine route.  . Naproxen Sodium (ALEVE) 220 MG CAPS Take by mouth.  . phentermine (ADIPEX-P) 37.5 MG tablet Take 56.25 mg by mouth daily.  . [DISCONTINUED] cephALEXin (KEFLEX) 500 MG capsule Take 1 capsule (500 mg total) by mouth 2 (two) times daily.  . [DISCONTINUED] meloxicam (MOBIC) 15 MG tablet One tab PO qAM with breakfast for 2 weeks, then daily prn pain.   No facility-administered encounter medications on file as of 09/10/2017.          Objective:   Physical Exam  Constitutional: She is oriented to person, place, and time. She appears well-developed and well-nourished.  HENT:  Head: Normocephalic and atraumatic.  Eyes: Conjunctivae and EOM are normal.  Cardiovascular: Normal rate.  Pulmonary/Chest: Effort normal.  Musculoskeletal:   Note some knots and some tenderness over the trapezius muscle between the neck and the shoulder.  Neurological: She is alert and oriented to person, place, and time.  Skin: Skin is dry. No pallor.  She does have a mostly hyperpigmented dry scaly rash underneath the chin over the neck.  No significant erythema but she does have a little bit of make-up on.  Psychiatric: She has a normal mood and affect. Her behavior is normal.  Vitals reviewed.         Assessment & Plan:  Rash-KOH skin scraping performed.  Will call with results.  If negative we will treat with a topical steroid.  If not better in 1 week on treatment and please let me know.  Right trapezius strain-given handout on stretches to do on her own.  Recommended using a heat wrap to try to relax the muscle tissue and gentle stretches.  If not improving please let me know.  Excess body/hair growth-we will check testosterone as well as FSH and LH.  I suspect she probably has PCOS.

## 2017-09-10 NOTE — Addendum Note (Signed)
Addended by: Deno Etienne on: 09/10/2017 11:36 AM   Modules accepted: Orders

## 2017-09-12 LAB — FUNGAL STAIN
FUNGAL SMEAR: NONE SEEN
MICRO NUMBER:: 90647151
SPECIMEN QUALITY: ADEQUATE

## 2017-09-13 LAB — FSH/LH
FSH: 2 m[IU]/mL
LH: 2.8 m[IU]/mL

## 2017-09-13 LAB — TESTOSTERONE, TOTAL, LC/MS/MS: TESTOSTERONE, TOTAL, LC-MS-MS: 39 ng/dL (ref 2–45)

## 2017-09-16 MED ORDER — TRIAMCINOLONE ACETONIDE 0.1 % EX CREA: 1.0000 "application " | TOPICAL_CREAM | Freq: Two times a day (BID) | CUTANEOUS | 0 refills | Status: DC | PRN

## 2017-09-16 NOTE — Addendum Note (Signed)
Addended by: Nani GasserMETHENEY, CATHERINE D on: 09/16/2017 10:15 AM   Modules accepted: Orders

## 2017-09-17 ENCOUNTER — Other Ambulatory Visit: Payer: Self-pay | Admitting: *Deleted

## 2017-09-17 DIAGNOSIS — Z5181 Encounter for therapeutic drug level monitoring: Secondary | ICD-10-CM

## 2017-09-17 DIAGNOSIS — L689 Hypertrichosis, unspecified: Secondary | ICD-10-CM

## 2017-09-17 MED ORDER — SPIRONOLACTONE 25 MG PO TABS: 25.0000 mg | ORAL_TABLET | Freq: Every day | ORAL | 2 refills | Status: DC

## 2017-09-17 NOTE — Addendum Note (Signed)
Addended by: Nani GasserMETHENEY, Davelle Anselmi D on: 09/17/2017 12:16 PM   Modules accepted: Orders

## 2017-10-28 DIAGNOSIS — E6609 Other obesity due to excess calories: Secondary | ICD-10-CM | POA: Diagnosis not present

## 2017-10-28 DIAGNOSIS — Z6835 Body mass index (BMI) 35.0-35.9, adult: Secondary | ICD-10-CM | POA: Diagnosis not present

## 2017-11-21 DIAGNOSIS — Z975 Presence of (intrauterine) contraceptive device: Secondary | ICD-10-CM | POA: Diagnosis not present

## 2017-11-21 DIAGNOSIS — Z124 Encounter for screening for malignant neoplasm of cervix: Secondary | ICD-10-CM | POA: Diagnosis not present

## 2017-11-21 DIAGNOSIS — Z01419 Encounter for gynecological examination (general) (routine) without abnormal findings: Secondary | ICD-10-CM | POA: Diagnosis not present

## 2017-11-21 DIAGNOSIS — Z113 Encounter for screening for infections with a predominantly sexual mode of transmission: Secondary | ICD-10-CM | POA: Diagnosis not present

## 2017-11-21 DIAGNOSIS — A630 Anogenital (venereal) warts: Secondary | ICD-10-CM | POA: Diagnosis not present

## 2017-11-27 DIAGNOSIS — Z6836 Body mass index (BMI) 36.0-36.9, adult: Secondary | ICD-10-CM | POA: Diagnosis not present

## 2017-11-27 DIAGNOSIS — E6609 Other obesity due to excess calories: Secondary | ICD-10-CM | POA: Diagnosis not present

## 2018-01-29 DIAGNOSIS — R5383 Other fatigue: Secondary | ICD-10-CM | POA: Diagnosis not present

## 2018-01-29 DIAGNOSIS — M25572 Pain in left ankle and joints of left foot: Secondary | ICD-10-CM | POA: Diagnosis not present

## 2018-01-29 DIAGNOSIS — R635 Abnormal weight gain: Secondary | ICD-10-CM | POA: Diagnosis not present

## 2018-01-29 DIAGNOSIS — R0602 Shortness of breath: Secondary | ICD-10-CM | POA: Diagnosis not present

## 2018-03-11 DIAGNOSIS — B977 Papillomavirus as the cause of diseases classified elsewhere: Secondary | ICD-10-CM | POA: Diagnosis not present

## 2018-03-11 DIAGNOSIS — L918 Other hypertrophic disorders of the skin: Secondary | ICD-10-CM | POA: Diagnosis not present

## 2018-03-11 DIAGNOSIS — N900 Mild vulvar dysplasia: Secondary | ICD-10-CM | POA: Insufficient documentation

## 2018-03-11 DIAGNOSIS — Z01812 Encounter for preprocedural laboratory examination: Secondary | ICD-10-CM | POA: Diagnosis not present

## 2018-03-25 DIAGNOSIS — S0100XA Unspecified open wound of scalp, initial encounter: Secondary | ICD-10-CM | POA: Diagnosis not present

## 2018-04-10 DIAGNOSIS — R509 Fever, unspecified: Secondary | ICD-10-CM | POA: Diagnosis not present

## 2018-04-10 DIAGNOSIS — J111 Influenza due to unidentified influenza virus with other respiratory manifestations: Secondary | ICD-10-CM | POA: Diagnosis not present

## 2018-04-10 DIAGNOSIS — R05 Cough: Secondary | ICD-10-CM | POA: Diagnosis not present

## 2018-05-22 DIAGNOSIS — R635 Abnormal weight gain: Secondary | ICD-10-CM | POA: Diagnosis not present

## 2018-05-22 DIAGNOSIS — Z975 Presence of (intrauterine) contraceptive device: Secondary | ICD-10-CM | POA: Diagnosis not present

## 2018-08-26 DIAGNOSIS — Z30432 Encounter for removal of intrauterine contraceptive device: Secondary | ICD-10-CM | POA: Diagnosis not present

## 2018-10-12 ENCOUNTER — Ambulatory Visit: Payer: BLUE CROSS/BLUE SHIELD | Admitting: Family Medicine

## 2018-10-30 ENCOUNTER — Telehealth (INDEPENDENT_AMBULATORY_CARE_PROVIDER_SITE_OTHER): Payer: BC Managed Care – PPO | Admitting: Family Medicine

## 2018-10-30 ENCOUNTER — Encounter: Payer: Self-pay | Admitting: Family Medicine

## 2018-10-30 VITALS — Ht 62.0 in | Wt 235.0 lb

## 2018-10-30 DIAGNOSIS — R350 Frequency of micturition: Secondary | ICD-10-CM | POA: Diagnosis not present

## 2018-10-30 DIAGNOSIS — R300 Dysuria: Secondary | ICD-10-CM

## 2018-10-30 MED ORDER — AMOXICILLIN-POT CLAVULANATE 875-125 MG PO TABS: 1.0000 | ORAL_TABLET | Freq: Two times a day (BID) | ORAL | 0 refills | Status: DC

## 2018-10-30 NOTE — Progress Notes (Signed)
Acute Office Visit  Subjective:    Patient ID: Julia [name], female    DOB: 12-23-1985, 33 y.o.   MRN: [id number]  Chief Complaint  Patient presents with  . burning on urination    HPI Patient is in today for Dysuria x  5 days.  No change in smell of the urine.  No fever, chills, back pain or nausea.  Some pelvic pressure. Getting some urgency.  No recent antibiotics.  No recent pelvic procedures.  Though she did have her IUD removed in May and she and her boyfriend have been trying to get pregnant.  So she wants to make sure that if she gets an antibiotic that it will be safe for pregnancy.  No other vaginal symptoms or fever.  Past Medical History:  Diagnosis Date  . Acne     Past Surgical History:  Procedure Laterality Date  . CHOLECYSTECTOMY  12/2008  . gallstone removal    . TONSILLECTOMY      Family History  Problem Relation Age of Onset  . Asthma Mother   . Hypertension Mother   . Alcohol abuse Father   . Alcohol abuse Maternal Grandmother     Social History   Socioeconomic History  . Marital status: Single    Spouse name: Not on file  . Number of children: Not on file  . Years of education: HS  . Highest education level: Not on file  Occupational History  . Occupation: shipping/Receiving clear    Comment: [employer]  Social Needs  . Financial resource strain: Not on file  . Food insecurity    Worry: Not on file    Inability: Not on file  . Transportation needs    Medical: Not on file    Non-medical: Not on file  Tobacco Use  . Smoking status: Never Smoker  . Smokeless tobacco: Never Used  Substance and Sexual Activity  . Alcohol use: Yes    Comment: 3 x monthly  . Drug use: No  . Sexual activity: Yes    Partners: Male  Lifestyle  . Physical activity    Days per week: Not on file    Minutes per session: Not on file  . Stress: Not on file  Relationships  . Social Herbalist on phone: Not on file    Gets together: Not on file   Attends religious service: Not on file    Active member of club or organization: Not on file    Attends meetings of clubs or organizations: Not on file    Relationship status: Not on file  . Intimate partner violence    Fear of current or ex partner: Not on file    Emotionally abused: Not on file    Physically abused: Not on file    Forced sexual activity: Not on file  Other Topics Concern  . Not on file  Social History Narrative   2 bottles of soda per day.  No regular exericise.  Lives with her mother and brother.    Outpatient Medications Prior to Visit  Medication Sig Dispense Refill  . Naproxen Sodium (ALEVE) 220 MG CAPS Take by mouth.    . triamcinolone cream (KENALOG) 0.1 % Apply 1 application topically 2 (two) times daily as needed. 30 g 0  . spironolactone (ALDACTONE) 25 MG tablet Take 1 tablet (25 mg total) by mouth daily. 30 tablet 2   No facility-administered medications prior to visit.     Allergies  Allergen Reactions  . Latex   . Morphine And Related     Grandfather died from morphine    ROS     Objective:    Physical Exam  Ht 5\' 2"  (1.575 m)   Wt 235 lb (106.6 kg)   BMI 42.98 kg/m  Wt Readings from Last 3 Encounters:  10/30/18 235 lb (106.6 kg)  09/10/17 206 lb (93.4 kg)  05/13/16 207 lb (93.9 kg)    Health Maintenance Due  Topic Date Due  . HIV Screening  11/16/2001  . PAP SMEAR-Modifier  11/06/2018    There are no preventive care reminders to display for this patient.   No results found for: TSH Lab Results  Component Value Date   WBC 6.9 05/13/2016   HGB 13.5 05/13/2016   HCT 40.8 05/13/2016   MCV 87.4 05/13/2016   PLT 220 05/13/2016   Lab Results  Component Value Date   NA 141 05/13/2016   K 4.4 05/13/2016   CO2 25 05/13/2016   GLUCOSE 78 05/13/2016   BUN 10 05/13/2016   CREATININE 0.70 05/13/2016   BILITOT 0.4 05/13/2016   ALKPHOS 46 05/13/2016   AST 12 05/13/2016   ALT 13 05/13/2016   PROT 6.8 05/13/2016   ALBUMIN  4.1 05/13/2016   CALCIUM 9.3 05/13/2016   No results found for: CHOL No results found for: HDL No results found for: LDLCALC No results found for: TRIG No results found for: CHOLHDL No results found for: [id number]     Assessment & Plan:   Problem List Items Addressed This Visit    None    Visit Diagnoses    Dysuria    -  Primary   Urinary frequency         Likely UTI based on sxs.  Will tx with Augmentin.  Call if not better after weekend. Will plan to get a culture at that time if she is not improving.  Meds ordered this encounter  Medications  . amoxicillin-clavulanate (AUGMENTIN) 875-125 MG tablet    Sig: Take 1 tablet by mouth 2 (two) times daily.    Dispense:  6 tablet    Refill:  0     Nani Gasseratherine Metheney, MD

## 2018-11-02 ENCOUNTER — Telehealth: Payer: BC Managed Care – PPO | Admitting: Family Medicine

## 2018-11-04 ENCOUNTER — Emergency Department (HOSPITAL_BASED_OUTPATIENT_CLINIC_OR_DEPARTMENT_OTHER): Payer: BC Managed Care – PPO

## 2018-11-04 ENCOUNTER — Other Ambulatory Visit: Payer: Self-pay

## 2018-11-04 ENCOUNTER — Encounter (HOSPITAL_BASED_OUTPATIENT_CLINIC_OR_DEPARTMENT_OTHER): Payer: Self-pay | Admitting: Emergency Medicine

## 2018-11-04 ENCOUNTER — Emergency Department (HOSPITAL_BASED_OUTPATIENT_CLINIC_OR_DEPARTMENT_OTHER)
Admission: EM | Admit: 2018-11-04 | Discharge: 2018-11-04 | Disposition: A | Discharge status: Home / Self Care | Payer: BC Managed Care – PPO | Attending: Emergency Medicine | Admitting: Emergency Medicine

## 2018-11-04 DIAGNOSIS — R300 Dysuria: Secondary | ICD-10-CM | POA: Insufficient documentation

## 2018-11-04 DIAGNOSIS — Z91040 Latex allergy status: Secondary | ICD-10-CM | POA: Diagnosis not present

## 2018-11-04 DIAGNOSIS — R339 Retention of urine, unspecified: Secondary | ICD-10-CM | POA: Insufficient documentation

## 2018-11-04 DIAGNOSIS — R1030 Lower abdominal pain, unspecified: Secondary | ICD-10-CM | POA: Insufficient documentation

## 2018-11-04 DIAGNOSIS — N83202 Unspecified ovarian cyst, left side: Secondary | ICD-10-CM | POA: Diagnosis not present

## 2018-11-04 LAB — CBC WITH DIFFERENTIAL/PLATELET
Abs Immature Granulocytes: 0.01 10*3/uL (ref 0.00–0.07)
Basophils Absolute: 0 10*3/uL (ref 0.0–0.1)
Basophils Relative: 0 %
Eosinophils Absolute: 0.1 10*3/uL (ref 0.0–0.5)
Eosinophils Relative: 1 %
HCT: 45.1 % (ref 36.0–46.0)
Hemoglobin: 14.5 g/dL (ref 12.0–15.0)
Immature Granulocytes: 0 %
Lymphocytes Relative: 30 %
Lymphs Abs: 2.3 10*3/uL (ref 0.7–4.0)
MCH: 28.4 pg (ref 26.0–34.0)
MCHC: 32.2 g/dL (ref 30.0–36.0)
MCV: 88.4 fL (ref 80.0–100.0)
Monocytes Absolute: 0.6 10*3/uL (ref 0.1–1.0)
Monocytes Relative: 7 %
Neutro Abs: 4.7 10*3/uL (ref 1.7–7.7)
Neutrophils Relative %: 62 %
Platelets: 203 10*3/uL (ref 150–400)
RBC: 5.1 MIL/uL (ref 3.87–5.11)
RDW: 13.2 % (ref 11.5–15.5)
WBC: 7.6 10*3/uL (ref 4.0–10.5)
nRBC: 0 % (ref 0.0–0.2)

## 2018-11-04 LAB — LIPASE, BLOOD: Lipase: 32 U/L (ref 11–51)

## 2018-11-04 LAB — COMPREHENSIVE METABOLIC PANEL
ALT: 32 U/L (ref 0–44)
AST: 20 U/L (ref 15–41)
Albumin: 4.3 g/dL (ref 3.5–5.0)
Alkaline Phosphatase: 46 U/L (ref 38–126)
Anion gap: 12 (ref 5–15)
BUN: 15 mg/dL (ref 6–20)
CO2: 22 mmol/L (ref 22–32)
Calcium: 9.1 mg/dL (ref 8.9–10.3)
Chloride: 103 mmol/L (ref 98–111)
Creatinine, Ser: 0.78 mg/dL (ref 0.44–1.00)
GFR calc Af Amer: >60 mL/min (ref >60)
GFR calc non Af Amer: >60 mL/min (ref >60)
Glucose, Bld: 86 mg/dL (ref 70–99)
Potassium: 3.6 mmol/L (ref 3.5–5.1)
Sodium: 137 mmol/L (ref 135–145)
Total Bilirubin: 0.7 mg/dL (ref 0.3–1.2)
Total Protein: 7.8 g/dL (ref 6.5–8.1)

## 2018-11-04 LAB — URINALYSIS, ROUTINE W REFLEX MICROSCOPIC
Bilirubin Urine: NEGATIVE
Glucose, UA: NEGATIVE mg/dL
Hgb urine dipstick: NEGATIVE
Ketones, ur: NEGATIVE mg/dL
Leukocytes,Ua: NEGATIVE
Nitrite: NEGATIVE
Protein, ur: NEGATIVE mg/dL
Specific Gravity, Urine: >1.03 — ABNORMAL HIGH (ref 1.005–1.030)
pH: 5.5 (ref 5.0–8.0)

## 2018-11-04 LAB — PREGNANCY, URINE: Preg Test, Ur: NEGATIVE

## 2018-11-04 NOTE — ED Triage Notes (Signed)
Finished rx of Augmentin for UTI yesterday.  Since 6:45 this morning she has not been able to void.

## 2018-11-04 NOTE — ED Provider Notes (Signed)
[facility] EMERGENCY DEPARTMENT Provider Note   CSN: [id number] Arrival date & time: 11/04/18  1842    History   Chief Complaint Chief Complaint  Patient presents with  . Urinary Retention    HPI Julia [name] is a 33 y.o. female who presents to the ED today complaining of urinary retention that began yesterday, worse today. Pt reports that last week she was having dysuria. She saw her PCP who diagnosed her with UTI and prescribed Augmentin. Reports that she has been trying to conceive and her doctor did not want to prescribe anything stronger. LNMP 3 weeks ago. She states that her dysuria improved but yesterday she began having difficulty voiding. She was able to do so after sitting on the toilet for awhile and really trying to urinate. She reports the same thing happened to her this morning at 6:45 AM which is the last time she has been able to urinate. She feels the urge to go but when she attempts she is unable to do so. She also complains of bilateral lower abdominal cramping since being unable to urinate. Denies fever, chills, nausea, vomiting, diarrhea, constipation, pelvic pain, vaginal bleeding, or any other associated symptoms.        Past Medical History:  Diagnosis Date  . Acne     Patient Active Problem List   Diagnosis Date Noted  . Crushing injury of right ring finger 05/13/2016  . Abdominal pain 05/13/2016  . Choledocholithiasis 09/29/2015  . Pancreatitis, acute 09/29/2015  . Back pain with radiation 09/20/2015  . Elevated liver enzymes 09/20/2015  . Abdominal pain, epigastric 09/20/2015  . Non-intractable cyclical vomiting with nausea 09/20/2015  . Mass of left lower leg 05/26/2014  . Obesity, unspecified 04/27/2013    Past Surgical History:  Procedure Laterality Date  . CHOLECYSTECTOMY  12/2008  . gallstone removal    . TONSILLECTOMY       OB History    Gravida  0   Para  0   Term  0   Preterm  0   AB  0   Living  0     SAB   0   TAB  0   Ectopic  0   Multiple  0   Live Births               Home Medications    Prior to Admission medications   Medication Sig Start Date End Date Taking? Authorizing Provider  amoxicillin-clavulanate (AUGMENTIN) 875-125 MG tablet Take 1 tablet by mouth 2 (two) times daily. 10/30/18   Agapito GamesMetheney, Catherine D, MD  Naproxen Sodium (ALEVE) 220 MG CAPS Take by mouth.    [provider]  triamcinolone cream (KENALOG) 0.1 % Apply 1 application topically 2 (two) times daily as needed. 09/16/17   Agapito GamesMetheney, Catherine D, MD    Family History Family History  Problem Relation Age of Onset  . Asthma Mother   . Hypertension Mother   . Alcohol abuse Father   . Alcohol abuse Maternal Grandmother     Social History Social History   Tobacco Use  . Smoking status: Never Smoker  . Smokeless tobacco: Never Used  Substance Use Topics  . Alcohol use: Yes    Comment: 3 x monthly  . Drug use: No     Allergies   Latex and Morphine and related   Review of Systems Review of Systems  Constitutional: Negative for chills and fever.  HENT: Negative for congestion.   Eyes: Negative for  visual disturbance.  Respiratory: Negative for cough and shortness of breath.   Cardiovascular: Negative for chest pain.  Gastrointestinal: Positive for abdominal pain. Negative for constipation, diarrhea, nausea and vomiting.  Genitourinary: Positive for decreased urine volume and difficulty urinating. Negative for dysuria, frequency, pelvic pain, vaginal bleeding and vaginal discharge.  Musculoskeletal: Negative for myalgias.  Skin: Negative for rash.  Neurological: Negative for headaches.     Physical Exam Updated Vital Signs BP 127/83 (BP Location: Right Arm)   Pulse 89   Temp 98.4 F (36.9 C) (Oral)   Resp 16   Ht 5\' 2"  (1.575 m)   Wt 115.7 kg   LMP 10/10/2018   SpO2 100%   BMI 46.64 kg/m   Physical Exam Vitals signs and nursing note reviewed.  Constitutional:       Appearance: She is not ill-appearing.  HENT:     Head: Normocephalic and atraumatic.  Eyes:     Conjunctiva/sclera: Conjunctivae normal.  Neck:     Musculoskeletal: Neck supple.  Cardiovascular:     Rate and Rhythm: Normal rate and regular rhythm.     Pulses: Normal pulses.  Pulmonary:     Effort: Pulmonary effort is normal.     Breath sounds: Normal breath sounds. No wheezing, rhonchi or rales.  Abdominal:     Palpations: Abdomen is soft.     Tenderness: There is abdominal tenderness. There is left CVA tenderness. There is no guarding or rebound.     Comments: Mild suprapubic tenderness to palpation. No rebound or guarding.   Musculoskeletal:     Right lower leg: No edema.     Left lower leg: No edema.     Comments: No C, T, or L midline spinal tenderness. Strength and sensation intact to BLEs.   Skin:    General: Skin is warm and dry.  Neurological:     Mental Status: She is alert.      ED Treatments / Results  Labs (all labs ordered are listed, but only abnormal results are displayed) Labs Reviewed  URINALYSIS, ROUTINE W REFLEX MICROSCOPIC - Abnormal; Notable for the following components:      Result Value   Specific Gravity, Urine >1.030 (*)    All other components within normal limits  PREGNANCY, URINE  COMPREHENSIVE METABOLIC PANEL  CBC WITH DIFFERENTIAL/PLATELET  LIPASE, BLOOD    EKG None  Radiology Ct Abdomen Pelvis Wo Contrast  Result Date: 11/04/2018 CLINICAL DATA:  Urinary retention. Recent urinary tract infection. EXAM: CT ABDOMEN AND PELVIS WITHOUT CONTRAST TECHNIQUE: Multidetector CT imaging of the abdomen and pelvis was performed following the standard protocol without IV contrast. COMPARISON:  CT scan dated 08/06/2013 FINDINGS: Lower chest: Normal. Hepatobiliary: No focal liver abnormality is seen. Status post cholecystectomy. No biliary dilatation. Pancreas: Unremarkable. No pancreatic ductal dilatation or surrounding inflammatory changes. Spleen:  Normal in size without focal abnormality. Adrenals/Urinary Tract: Adrenal glands are unremarkable. Kidneys are normal, without renal calculi, focal lesion, or hydronephrosis. Bladder is unremarkable. Stomach/Bowel: Stomach is within normal limits. Appendix appears normal. No evidence of bowel wall thickening, distention, or inflammatory changes. Vascular/Lymphatic: No significant vascular findings are present. No enlarged abdominal or pelvic lymph nodes. Reproductive: 4 cm cyst on the otherwise normal left ovary. Normal right ovary. Normal uterus. Other: No abdominal wall hernia or abnormality. No abdominopelvic ascites. Musculoskeletal: No acute or significant osseous findings. IMPRESSION: 1. 4 cm cyst on the otherwise normal left ovary. 2. Otherwise, benign-appearing abdomen and pelvis. Electronically Signed   By: Fayrene FearingJames  Maxwell M.D.   On: 11/04/2018 20:51    Procedures Procedures (including critical care time)  Medications Ordered in ED Medications - No data to display   Initial Impression / Assessment and Plan / ED Course  I have reviewed the triage vital signs and the nursing notes.  Pertinent labs & imaging results that were available during my care of the patient were reviewed by me and considered in my medical decision making (see chart for details).    33 year old female presenting with difficulty urinating since yesterday with retention today. Recently treated with Augmentin for UTI. Attempting to conceive currently. LNMP 3 weeks ago. Pt also complains of lower abdominal cramping since being unable to urinate and lower back pain. No hx of IVDA or chronic steroid use. No focal neuro deficits on exam and pt able to ambulate without difficulty. Very little concern for cauda equina today. Bladder scan obtained in the ED with > 300 CCs urine. Will perform in and out cath today for relief. Pt with positive left CVA tenderness on exam. Concern for pyelo vs kidney stone vs hydro today. Will obtain  urine preg prior to discussing need for CT scan today. Baseline bloodwork also obtained.   Urine preg neg. CT scan ordered.   U/A negative for infection. CBC without leukocytosis. Lipase negative. No electrolyte abnormalities and creatinine within normal limits. CT scan without obvious findings besides left ovarian cyst although do not suspect this to be the cause of pt's symptoms. Pt feeling improved after in and out cath. Difficult to say what is causing pt's symptoms but feel confident it is not emergent. She has an appointment with her PCP scheduled for tomorrow PM. Advised to keep and follow up. Strict return precautions discussed with patient. She is in agreement with plan at this time and stable for discharge home.       Final Clinical Impressions(s) / ED Diagnoses   Final diagnoses:  Urinary retention    ED Discharge Orders    None       Eustaquio Maize, PA-C 11/04/18 2141    Veryl Speak, MD 11/04/18 2213

## 2018-11-04 NOTE — Discharge Instructions (Signed)
You were seen in the ED today for urinary retention Your labwork was reassuring today without signs of a UTI or kidney infection/kidney stone Please keep appointment with your PCP as scheduled for tomorrow PM Return to the ED for any worsening symptoms

## 2018-11-05 ENCOUNTER — Encounter: Payer: Self-pay | Admitting: Family Medicine

## 2018-11-05 ENCOUNTER — Ambulatory Visit (INDEPENDENT_AMBULATORY_CARE_PROVIDER_SITE_OTHER): Payer: BC Managed Care – PPO

## 2018-11-05 ENCOUNTER — Ambulatory Visit (INDEPENDENT_AMBULATORY_CARE_PROVIDER_SITE_OTHER): Payer: BC Managed Care – PPO | Admitting: Family Medicine

## 2018-11-05 VITALS — BP 131/88 | HR 93 | Ht 62.0 in | Wt 246.0 lb

## 2018-11-05 DIAGNOSIS — R202 Paresthesia of skin: Secondary | ICD-10-CM

## 2018-11-05 DIAGNOSIS — M545 Low back pain, unspecified: Secondary | ICD-10-CM

## 2018-11-05 DIAGNOSIS — R3911 Hesitancy of micturition: Secondary | ICD-10-CM

## 2018-11-05 NOTE — Progress Notes (Signed)
Established Patient Office Visit  Subjective:  Patient ID: Julia [name], female    DOB: [birth year]  Age: 33 y.o. MRN: [id number]  CC:  Chief Complaint  Patient presents with  . Follow-up    uti    HPI Julia [name] presents for F/U from UC visit.  We did a video visit on 7/17 for UTI. She was treated with Augmentin and felt better until the day after the finished her antibiotic. She went to the ED yesterday bc couldn't urinate all days and had lower abdominal pain.  They did end up doing it and out cath and they were able to drain 500 cc of urine.  They did do a CT abdomen and pelvis in the emergency department which showed a 4 cm cyst on the left ovary but otherwise no other specific or worrisome findings to explain her symptoms.  He also did lab work her CBC and CMP were normal.  Urinalysis was negative for any type of infection. Urine regnancy is neg. she reports that she is also had some left low back pain just to the left of the lumbar spine.  She says it started about a day before the difficulty urinating.  She reports that she actually fell about 2 months ago and feels like she bruised her either fractured her tailbone at that time.  She says since the back pain started she has been feeling some tingling like pinpricks in both legs all the way down.  She says they do not feel completely numb and she is not lost function.  She is not fallen or had any weakness but says it just feels like little pinpricks all the way down to her feet bilaterally.  No fevers chills or sweats.  Past Medical History:  Diagnosis Date  . Acne     Past Surgical History:  Procedure Laterality Date  . CHOLECYSTECTOMY  12/2008  . gallstone removal    . TONSILLECTOMY      Family History  Problem Relation Age of Onset  . Asthma Mother   . Hypertension Mother   . Alcohol abuse Father   . Alcohol abuse Maternal Grandmother     Social History   Socioeconomic History  . Marital status: Single   Spouse name: Not on file  . Number of children: Not on file  . Years of education: HS  . Highest education level: Not on file  Occupational History  . Occupation: shipping/Receiving clear    Comment: [employer]  Social Needs  . Financial resource strain: Not on file  . Food insecurity    Worry: Not on file    Inability: Not on file  . Transportation needs    Medical: Not on file    Non-medical: Not on file  Tobacco Use  . Smoking status: Never Smoker  . Smokeless tobacco: Never Used  Substance and Sexual Activity  . Alcohol use: Yes    Comment: 3 x monthly  . Drug use: No  . Sexual activity: Yes    Partners: Male  Lifestyle  . Physical activity    Days per week: Not on file    Minutes per session: Not on file  . Stress: Not on file  Relationships  . Social Herbalist on phone: Not on file    Gets together: Not on file    Attends religious service: Not on file    Active member of club or organization: Not on file    Attends  meetings of clubs or organizations: Not on file    Relationship status: Not on file  . Intimate partner violence    Fear of current or ex partner: Not on file    Emotionally abused: Not on file    Physically abused: Not on file    Forced sexual activity: Not on file  Other Topics Concern  . Not on file  Social History Narrative   2 bottles of soda per day.  No regular exericise.  Lives with her mother and brother.    Outpatient Medications Prior to Visit  Medication Sig Dispense Refill  . Naproxen Sodium (ALEVE) 220 MG CAPS Take by mouth.    Marland Kitchen amoxicillin-clavulanate (AUGMENTIN) 875-125 MG tablet Take 1 tablet by mouth 2 (two) times daily. 6 tablet 0  . triamcinolone cream (KENALOG) 0.1 % Apply 1 application topically 2 (two) times daily as needed. 30 g 0   No facility-administered medications prior to visit.     Allergies  Allergen Reactions  . Latex   . Morphine And Related     Grandfather died from morphine    ROS Review of  Systems    Objective:    Physical Exam  Constitutional: She is oriented to person, place, and time. She appears well-developed and well-nourished.  HENT:  Head: Normocephalic and atraumatic.  Eyes: Conjunctivae and EOM are normal.  Cardiovascular: Normal rate.  Pulmonary/Chest: Effort normal.  Musculoskeletal:     Comments: She is a little bit tender over the lower lumbar spine and just to the left of the over the paraspinous muscles.  Normal flexion and extension.  Hip, knee, ankle strength is 5/5 bilaterally with patellar reflexes 1+ bilaterally.  Neurological: She is alert and oriented to person, place, and time. She displays normal reflexes.  Sensation intact over both legs.  Skin: Skin is dry. No pallor.  Psychiatric: She has a normal mood and affect. Her behavior is normal.  Vitals reviewed.   BP 131/88   Pulse 93   Ht _0  (1.575 m)   Wt 246 lb (111.6 kg)   LMP 10/10/2018   SpO2 98%   BMI 44.99 kg/m  Wt Readings from Last 3 Encounters:  11/05/18 246 lb (111.6 kg)  11/04/18 255 lb (115.7 kg)  10/30/18 235 lb (106.6 kg)     Health Maintenance Due  Topic Date Due  . HIV Screening  11/16/2001  . PAP SMEAR-Modifier  11/06/2018    There are no preventive care reminders to display for this patient.  No results found for: TSH Lab Results  Component Value Date   WBC 7.6 11/04/2018   HGB 14.5 11/04/2018   HCT 45.1 11/04/2018   MCV 88.4 11/04/2018   PLT 203 11/04/2018   Lab Results  Component Value Date   NA 137 11/04/2018   K 3.6 11/04/2018   CO2 22 11/04/2018   GLUCOSE 86 11/04/2018   BUN 15 11/04/2018   CREATININE 0.78 11/04/2018   BILITOT 0.7 11/04/2018   ALKPHOS 46 11/04/2018   AST 20 11/04/2018   ALT 32 11/04/2018   PROT 7.8 11/04/2018   ALBUMIN 4.3 11/04/2018   CALCIUM 9.1 11/04/2018   ANIONGAP 12 11/04/2018   No results found for: CHOL No results found for: HDL No results found for: LDLCALC No results found for: TRIG No results found for:  CHOLHDL No results found for: HGBA1C    Assessment & Plan:   Problem List Items Addressed This Visit    None  Visit Diagnoses    Urinary hesitancy    -  Primary   Relevant Orders   Ambulatory referral to Urology   Paresthesia       Relevant Orders   Ambulatory referral to Urology   Lumbar back pain       Relevant Orders   DG Lumbar Spine Complete (Completed)     Urinary hesitancy - unclear why she can't urinate. Lat time today was about 5 hours today. Discussed what to do tonight. If not able to urinate this evening can I did give her catheter kit to take home.  Her fianc is an EMT and might be able to help with this.  Gust the importance of cleaning really well before inserting the catheter.  Try to get her in with urology ASAP.  Paresthesias -does sound like this could be related to the hesitancy she has had this pinprick sensation in both legs going on for the last several days.  I would like to get an x-ray of the lumbar spine did not see any notes or comments on the CT scan and I looked at it myself and did not see any significant abnormalities.consider other etiologies like MS.    Left low back pain - consider MSK strain though no specific injury. Ok ot use NSAIDs and work on stretches.call if not imprroving over next 2 weeks. Xrays today were normal.     No orders of the defined types were placed in this encounter.   Follow-up: No follow-ups on file.    Beatrice Lecher, MD

## 2018-11-06 ENCOUNTER — Encounter: Payer: Self-pay | Admitting: Family Medicine

## 2018-11-10 ENCOUNTER — Encounter: Payer: Self-pay | Admitting: Family Medicine

## 2018-11-10 ENCOUNTER — Telehealth (INDEPENDENT_AMBULATORY_CARE_PROVIDER_SITE_OTHER): Payer: BC Managed Care – PPO | Admitting: Family Medicine

## 2018-11-10 DIAGNOSIS — N912 Amenorrhea, unspecified: Secondary | ICD-10-CM | POA: Diagnosis not present

## 2018-11-10 DIAGNOSIS — R3911 Hesitancy of micturition: Secondary | ICD-10-CM | POA: Diagnosis not present

## 2018-11-10 NOTE — Progress Notes (Signed)
She had 2 positive pregnancy tests yesterday at home. She had a urine pregnancy done when she was seen at the ED on 11/04/2018 and it was neg. She also had a CT done of her abdomen and pelvis.   She stated that she has an appt for an [place] to be done 8/17 for confirmation..  She is asking for another note for work because she did not go back on 7/25  .Julia [name], [place]

## 2018-11-10 NOTE — Progress Notes (Signed)
Virtual Visit via Video Note  I connected with Julia [name] on 11/10/18 at 10:30 AM EDT by a video enabled telemedicine application and verified that I am speaking with the correct person using two identifiers.   I discussed the limitations of evaluation and management by telemedicine and the availability of in person appointments. The patient expressed understanding and agreed to proceed.  Subjective:    CC:   HPI: She had 2 positive pregnancy tests yesterday at home. She had a urine pregnancy done when she was seen at the ED on 11/04/2018 and it was neg. She also had a CT done of her abdomen and pelvis. Her LMP 10/10/2018  She stated that she has an appt for an [place] to be done 8/17 for confirmation..  She is asking for another note for work because she did not go back on 7/25.   She is still having problems urinating some but has been going some.  She has noticed she has been constipated.  She took som Miralax yesterday. Has only had one BM in the last 8 days.   Called about appt with Urology. She hasn't made appt yet  Has made OB appt on 8/17 for [place] confirmation at what would be 7 weeks.  [facility] in [place].     Past medical history, Surgical history, Family history not pertinant except as noted below, Social history, Allergies, and medications have been entered into the medical record, reviewed, and corrections made.   Review of Systems: No fevers, chills, night sweats, weight loss, chest pain, or shortness of breath.   Objective:    General: Speaking clearly in complete sentences without any shortness of breath.  Alert and oriented x3.  Normal judgment. No apparent acute distress.    Impression and Recommendations:    Amenorrhea - will get blood test to confirm.  Will call for results.      Urinary hesitancy - could be secondary to constipation.  Ok to use Miralax nightly until has a good soft or mushy BM. Can use stool softeners as well  She will go ahead and  make Urology appt as well.    I discussed the assessment and treatment plan with the patient. The patient was provided an opportunity to ask questions and all were answered. The patient agreed with the plan and demonstrated an understanding of the instructions.   The patient was advised to call back or seek an in-person evaluation if the symptoms worsen or if the condition fails to improve as anticipated.   Beatrice Lecher, MD

## 2018-11-11 LAB — HCG, QUANTITATIVE, PREGNANCY: HCG, Total, QN: 107 m[IU]/mL

## 2018-11-15 ENCOUNTER — Encounter (HOSPITAL_COMMUNITY): Payer: Self-pay

## 2018-11-15 ENCOUNTER — Other Ambulatory Visit: Payer: Self-pay

## 2018-11-15 ENCOUNTER — Inpatient Hospital Stay (HOSPITAL_COMMUNITY)
Admission: AD | Admit: 2018-11-15 | Discharge: 2018-11-15 | Disposition: A | Discharge status: Home / Self Care | Payer: BC Managed Care – PPO | Attending: Obstetrics & Gynecology | Admitting: Obstetrics & Gynecology

## 2018-11-15 DIAGNOSIS — O26891 Other specified pregnancy related conditions, first trimester: Secondary | ICD-10-CM | POA: Diagnosis not present

## 2018-11-15 DIAGNOSIS — O98811 Other maternal infectious and parasitic diseases complicating pregnancy, first trimester: Secondary | ICD-10-CM | POA: Diagnosis not present

## 2018-11-15 DIAGNOSIS — B379 Candidiasis, unspecified: Secondary | ICD-10-CM | POA: Diagnosis not present

## 2018-11-15 DIAGNOSIS — R3911 Hesitancy of micturition: Secondary | ICD-10-CM | POA: Diagnosis not present

## 2018-11-15 DIAGNOSIS — Z3A01 Less than 8 weeks gestation of pregnancy: Secondary | ICD-10-CM

## 2018-11-15 DIAGNOSIS — N898 Other specified noninflammatory disorders of vagina: Secondary | ICD-10-CM

## 2018-11-15 DIAGNOSIS — R202 Paresthesia of skin: Secondary | ICD-10-CM

## 2018-11-15 LAB — WET PREP, GENITAL
Sperm: NONE SEEN
Trich, Wet Prep: NONE SEEN

## 2018-11-15 MED ORDER — TERCONAZOLE 0.4 % VA CREA: 1.0000 | TOPICAL_CREAM | Freq: Every day | VAGINAL | 0 refills | Status: AC

## 2018-11-15 NOTE — MAU Note (Signed)
Julia [name] is a 33 y.o. at [redacted]w[redacted]d here in MAU reporting: was dx with UTI, took Augmentin, states she was dx via virtual visit. States 2 days after finishing medication (last Wednesday) she stopped being able to urinate. Went to [facility] and they drained her bladder. Has had a CT and Xray and both were normal. States she has been able to void this AM but she is only voiding 2 times a day but she is having to force the urine out. Reporting she is having some pain in her lower abdomen, states the pain is worse when she feels like her bladder is full. Denies any UTI symptoms. No vaginal bleeding. States she is having a thick white discharge, having some clumping with the discharge, some itching.   LMP: 10/10/18  Onset of complaint: ongoing  Pain score: 5/10  Vitals:   11/15/18 1003  BP: 124/78  Pulse: 79  Resp: 16  Temp: 98.3 F (36.8 C)  SpO2: 100%     Lab orders placed from triage: UA

## 2018-11-15 NOTE — Discharge Instructions (Signed)
Acute Urinary Retention, Female  Acute urinary retention is a condition in which a person is unable to pass urine. This can last for a short time or for a long time. If left untreated, it can result in kidney damage or other serious complications. What are the causes? This condition may be caused by: O Vaginal Yeast infection, Adult  Vaginal yeast infection is a condition that causes vaginal discharge as well as soreness, swelling, and redness (inflammation) of the vagina. This is a common condition. Some women get this infection frequently. What are the causes? This condition is caused by a change in the normal balance of the yeast (candida) and bacteria that live in the vagina. This change causes an overgrowth of yeast, which causes the inflammation. What increases the risk? The condition is more likely to develop in women who: Take antibiotic medicines. Have diabetes. Take birth control pills. Are pregnant. Douche often. Have a weak body defense system (immune system). Have been taking steroid medicines for a long time. Frequently wear tight clothing. What are the signs or symptoms? Symptoms of this condition include: White, thick, creamy vaginal discharge. Swelling, itching, redness, and irritation of the vagina. The lips of the vagina (vulva) may be affected as well. Pain or a burning feeling while urinating. Pain during sex. How is this diagnosed? This condition is diagnosed based on: Your medical history. A physical exam. A pelvic exam. Your health care provider will examine a sample of your vaginal discharge under a microscope. Your health care provider may send this sample for testing to confirm the diagnosis. How is this treated? This condition is treated with medicine. Medicines may be over-the-counter or prescription. You may be told to use one or more of the following: Medicine that is taken by mouth (orally). Medicine that is applied as a cream (topically). Medicine  that is inserted directly into the vagina (suppository). Follow these instructions at home:  Lifestyle Do not have sex until your health care provider approves. Tell your sex partner that you have a yeast infection. That person should go to his or her health care provider and ask if they should also be treated. Do not wear tight clothes, such as pantyhose or tight pants. Wear breathable cotton underwear. General instructions Take or apply over-the-counter and prescription medicines only as told by your health care provider. Eat more yogurt. This may help to keep your yeast infection from returning. Do not use tampons until your health care provider approves. Try taking a sitz bath to help with discomfort. This is a warm water bath that is taken while you are sitting down. The water should only come up to your hips and should cover your buttocks. Do this 3-4 times per day or as told by your health care provider. Do not douche. If you have diabetes, keep your blood sugar levels under control. Keep all follow-up visits as told by your health care provider. This is important. Contact a health care provider if: You have a fever. Your symptoms go away and then return. Your symptoms do not get better with treatment. Your symptoms get worse. You have new symptoms. You develop blisters in or around your vagina. You have blood coming from your vagina and it is not your menstrual period. You develop pain in your abdomen. Summary Vaginal yeast infection is a condition that causes discharge as well as soreness, swelling, and redness (inflammation) of the vagina. This condition is treated with medicine. Medicines may be over-the-counter or prescription. Take or  apply over-the-counter and prescription medicines only as told by your health care provider. Do not douche. Do not have sex or use tampons until your health care provider approves. Contact a health care provider if your symptoms do not get  better with treatment or your symptoms go away and then return. This information is not intended to replace advice given to you by your health care provider. Make sure you discuss any questions you have with your health care provider. Document Released: 01/09/2005 Document Revised: 08/18/2017 Document Reviewed: 08/18/2017 Elsevier Patient Education  2020 [employer]  bstruction or narrowing of the tube that drains the bladder (urethra). This may be caused by surgery or problems with nearby organs, which can press or squeeze the urethra.  Problems with the nerves in the bladder. These can be caused by diseases, such as multiple sclerosis, or by spinal cord injuries.  Certain medicines.  Tumors in the area of the pelvis, bladder, or urethra.  Degenerative cognitive conditions such as delirium or dementia.  Diabetes.  Vaginal childbirth.  Bladder or urinary tract infection.  Constipation.  Blood in the urine (hematuria).  Injury to the bladder or urethra.  Psychological (psychogenic) conditions. Someone may hold her urine due to trauma or because she does not want to use the bathroom. What are the signs or symptoms? Symptoms of this condition include:  Trouble urinating.  Pain in the lower abdomen. How is this diagnosed? This condition is diagnosed based on a physical exam and a medical history. You may also have other tests, including:  An ultrasound of the bladder or kidneys or both.  Blood tests.  A urine analysis.  Additional tests may be needed such as an MRI, kidney, or bladder function tests. How is this treated? Treatment for this condition may include:  Medicines.  Placing a thin, sterile tube (catheter) into the bladder to drain urine out of the body. This is called an indwelling urinary catheter. After being inserted, the catheter is held in place with a small balloon that is filled with sterile water. Urine drains from the catheter into a collection bag  outside of the body.  Behavioral therapy.  Treatment for any underlying conditions.  If needed, you may be treated in the hospital for kidney function problems or to manage other complications. Follow these instructions at home:  Take over-the-counter and prescription medicines only as told by your health care provider. Avoid certain medicines, such as decongestants, antihistamines, and some prescription medicines. Do not take any medicine unless your health care provider has approved.  If you were given an indwelling urinary catheter, take care of it as told by your health care provider.  Drink enough fluid to keep your urine clear or pale yellow.  If you were prescribed an antibiotic, take it as told by your health care provider. Do not stop taking the antibiotic even if you start to feel better.  Do not use any products that contain nicotine or tobacco, such as cigarettes and e-cigarettes. If you need help quitting, ask your health care provider.  Monitor any changes in your symptoms. Tell your health care provider about any changes.  If instructed, monitor your blood pressure at home. Report changes as told by your health care provider.  Keep all follow-up visits as told by your health care provider. This is important. Contact a health care provider if:  You have uncomfortable bladder contractions that you cannot control (spasms) or you leak urine with the spasms. Get help right away if:  You have chills or fever.  You have blood in your urine.  You have a catheter and: ? Your catheter stops draining urine. ? Your catheter falls out. Summary  Acute urinary retention is a condition in which a person is unable to pass urine. If left untreated, it can result in kidney damage or other serious complications.  This condition may be caused by surgery or problems with nearby organs, which can press or squeeze the urethra.  Treatment may include medicines and placement of an  indwelling urinary catheter.  Monitor any changes in your symptoms. Tell your health care provider about any changes. This information is not intended to replace advice given to you by your health care provider. Make sure you discuss any questions you have with your health care provider. Document Released: 03/31/2006 Document Revised: 03/14/2017 Document Reviewed: 05/03/2016 Elsevier Patient Education  2020 [employer]                   Safe Medications in Pregnancy    Acne: Benzoyl Peroxide Salicylic Acid  Backache/Headache: Tylenol: 2 regular strength every 4 hours OR              2 Extra strength every 6 hours  Colds/Coughs/Allergies: Benadryl (alcohol free) 25 mg every 6 hours as needed Breath right strips Claritin Cepacol throat lozenges Chloraseptic throat spray Cold-Eeze- up to three times per day Cough drops, alcohol free Flonase (by prescription only) Guaifenesin Mucinex Robitussin DM (plain only, alcohol free) Saline nasal spray/drops Sudafed (pseudoephedrine) & Actifed ** use only after [redacted] weeks gestation and if you do not have high blood pressure Tylenol Vicks Vaporub Zinc lozenges Zyrtec   Constipation: Colace Ducolax suppositories Fleet enema Glycerin suppositories Metamucil Milk of magnesia Miralax Senokot Smooth move tea  Diarrhea: Kaopectate Imodium A-D  *NO pepto Bismol  Hemorrhoids: Anusol Anusol HC Preparation H Tucks  Indigestion: Tums Maalox Mylanta Zantac  Pepcid  Insomnia: Benadryl (alcohol free) 25mg  every 6 hours as needed Tylenol PM Unisom, no Gelcaps  Leg Cramps: Tums MagGel  Nausea/Vomiting:  Bonine Dramamine Emetrol Ginger extract Sea bands Meclizine  Nausea medication to take during pregnancy:  Unisom (doxylamine succinate 25 mg tablets) Take one tablet daily at bedtime. If symptoms are not adequately controlled, the dose can be increased to a maximum recommended dose of two tablets daily (1/2 tablet  in the morning, 1/2 tablet mid-afternoon and one at bedtime). Vitamin B6 100mg  tablets. Take one tablet twice a day (up to 200 mg per day).  Skin Rashes: Aveeno products Benadryl cream or 25mg  every 6 hours as needed Calamine Lotion 1% cortisone cream  Yeast infection: Gyne-lotrimin 7 Monistat 7   **If taking multiple medications, please check labels to avoid duplicating the same active ingredients **take medication as directed on the label ** Do not exceed 4000 mg of tylenol in 24 hours **Do not take medications that contain aspirin or ibuprofen

## 2018-11-15 NOTE — MAU Provider Note (Signed)
History     CSN: [id number]  Arrival date and time: 11/15/18 2979   First Provider Initiated Contact with Patient 11/15/18 1058      Chief Complaint  Patient presents with  . Unable to urinate  . Vaginal Discharge  . Abdominal Pain   Ms. Julia [name] is a 33 y.o. G1P0000 at [redacted]w[redacted]d who presents to MAU for urinary hesitency. Pt has been dealing with this issue since 11/04/2018 and has been seen twice by her primary care doctor and once in the emergency department with normal CT of abdomen and pelvic and normal X-ray of the lumbar spine with the exception of a 108mm cyst on her right ovary. Pt was referred to urology, but reports she has not yet made an appointment. Pt reports she has been given a catheter to use at home, but reports that she does not want to use it. When asked the reason for presenting to MAU today for this on-going issue, pt states because "I want to pee." Pt reports she is able to pee, but does so infrequently and only with a significant amount of effort. This issue started before patient had a positive pregnancy test. Of note, pt did have a fall 3 months ago and landed on stairs on her tailbone and has been having pins and needles in her legs, bilaterally, x3weeks, without loss of function.  Onset: 11/04/2018 Location: bladder Duration: 1.5 weeks Character: pt urinates spontaneously twice per day with significant effort, but reports she does not feel like her bladder completely empties; pt reports she last urinated around 0630 today; pt reports drinking almost 100oz of fluid per day Aggravating/Associated: none/constipation, but pt reports she is eating Activia yogurt and the constipation has "cleared up," with her last BM around 1930 last night which she reports was a full emptying of bowels; pt also reports suprapubic abdominal pain that increases with increasing time between urination attempts, but disappears within 5 minutes of urination Relieving: none Treatment: was  treated with Augmentin for UTI immediately preceding this issue, smelling peppermint oil (worked for two days), running water (works for her and lessens effort, but does not help to increase frequency of urnation), while patient reports she has tried things that work for her, she has not had a return to a normal number of urinary excretions since 11/04/2018  Pt reports thick, white vaginal discharge with itching since immediately after finishing ABX for yeast infections  Pt denies VB, vaginal odor. Pt denies N/V, abdominal pain, constipation, diarrhea, or urinary problems. Pt denies fever, chills, fatigue, sweating or changes in appetite. Pt denies SOB or chest pain. Pt denies dizziness, HA, light-headedness, weakness.  Problems this pregnancy include: pt has not yet been seen. Allergies? Morphine, latex Current medications/supplements? PNVs, Tylenol PRN Prenatal care provider? [facility], first OB appt 11/30/2018  Pt's partner present for entire visit.   OB History    Gravida  1   Para  0   Term  0   Preterm  0   AB  0   Living  0     SAB  0   TAB  0   Ectopic  0   Multiple  0   Live Births              Past Medical History:  Diagnosis Date  . Acne     Past Surgical History:  Procedure Laterality Date  . CHOLECYSTECTOMY  12/2008  . gallstone removal    . TONSILLECTOMY  Family History  Problem Relation Age of Onset  . Asthma Mother   . Hypertension Mother   . Alcohol abuse Father   . Alcohol abuse Maternal Grandmother     Social History   Tobacco Use  . Smoking status: Never Smoker  . Smokeless tobacco: Never Used  Substance Use Topics  . Alcohol use: Not Currently    Comment: 3 x monthly  . Drug use: No    Allergies:  Allergies  Allergen Reactions  . Morphine And Related     Grandfather died from morphine States father had a severe reaction also   . Latex Rash    Medications Prior to Admission  Medication Sig  Dispense Refill Last Dose  . Naproxen Sodium (ALEVE) 220 MG CAPS Take by mouth.   Past Month at Unknown time    Review of Systems  Constitutional: Negative for chills, diaphoresis, fatigue and fever.  Respiratory: Negative for shortness of breath.   Cardiovascular: Negative for chest pain.  Gastrointestinal: Negative for abdominal pain, constipation, diarrhea, nausea and vomiting.  Genitourinary: Positive for decreased urine volume, difficulty urinating and vaginal discharge. Negative for dysuria, flank pain, frequency, pelvic pain, urgency and vaginal bleeding.  Musculoskeletal: Negative for back pain.  Neurological: Negative for dizziness, weakness, light-headedness and headaches.   Physical Exam   Blood pressure 124/78, pulse 79, temperature 98.3 F (36.8 C), resp. rate 16, height 5\' 2"  (1.575 m), weight 116.1 kg, last menstrual period 10/10/2018, SpO2 100 %.  Patient Vitals for the past 24 hrs:  BP Temp Pulse Resp SpO2 Height Weight  11/15/18 1003 124/78 98.3 F (36.8 C) 79 16 100 % - -  11/15/18 0955 - - - - - 5\' 2"  (1.575 m) 116.1 kg   Physical Exam  Constitutional: She is oriented to person, place, and time. She appears well-developed and well-nourished. No distress.  Respiratory: Effort normal.  GI: Soft. Normal appearance. She exhibits no distension and no mass. There is no hepatosplenomegaly. There is abdominal tenderness in the suprapubic area. There is no rebound, no guarding and no CVA tenderness.  Genitourinary: There is no rash, tenderness or lesion on the right labia. There is no rash, tenderness or lesion on the left labia. Uterus is not enlarged and not tender. Cervix exhibits no motion tenderness, no discharge and no friability. Right adnexum displays no mass, no tenderness and no fullness. Left adnexum displays no mass, no tenderness and no fullness.    Vaginal discharge (thin, scant, white, odorless vaginal discharge, no clumping or copious amount noted, pt denies  douching) present.     No vaginal bleeding.  No bleeding in the vagina.  Neurological: She is alert and oriented to person, place, and time.  Skin: Skin is warm and dry. She is not diaphoretic.  Psychiatric: She has a normal mood and affect. Her behavior is normal. Judgment and thought content normal.   Results for orders placed or performed during the hospital encounter of 11/15/18 (from the past 24 hour(s))  Wet prep, genital     Status: Abnormal   Collection Time: 11/15/18 11:28 AM   Specimen: Cervix  Result Value Ref Range   Yeast Wet Prep HPF POC PRESENT (A) NONE SEEN   Trich, Wet Prep NONE SEEN NONE SEEN   Clue Cells Wet Prep HPF POC PRESENT (A) NONE SEEN   WBC, Wet Prep HPF POC FEW (A) NONE SEEN   Sperm NONE SEEN    Ct Abdomen Pelvis Wo Contrast  Result Date: 11/04/2018 CLINICAL  DATA:  Urinary retention. Recent urinary tract infection. EXAM: CT ABDOMEN AND PELVIS WITHOUT CONTRAST TECHNIQUE: Multidetector CT imaging of the abdomen and pelvis was performed following the standard protocol without IV contrast. COMPARISON:  CT scan dated 08/06/2013 FINDINGS: Lower chest: Normal. Hepatobiliary: No focal liver abnormality is seen. Status post cholecystectomy. No biliary dilatation. Pancreas: Unremarkable. No pancreatic ductal dilatation or surrounding inflammatory changes. Spleen: Normal in size without focal abnormality. Adrenals/Urinary Tract: Adrenal glands are unremarkable. Kidneys are normal, without renal calculi, focal lesion, or hydronephrosis. Bladder is unremarkable. Stomach/Bowel: Stomach is within normal limits. Appendix appears normal. No evidence of bowel wall thickening, distention, or inflammatory changes. Vascular/Lymphatic: No significant vascular findings are present. No enlarged abdominal or pelvic lymph nodes. Reproductive: 4 cm cyst on the otherwise normal left ovary. Normal right ovary. Normal uterus. Other: No abdominal wall hernia or abnormality. No abdominopelvic  ascites. Musculoskeletal: No acute or significant osseous findings. IMPRESSION: 1. 4 cm cyst on the otherwise normal left ovary. 2. Otherwise, benign-appearing abdomen and pelvis. Electronically Signed   By: Francene BoyersJames  Maxwell M.D.   On: 11/04/2018 20:51   Dg Lumbar Spine Complete  Result Date: 11/06/2018 CLINICAL DATA:  Left low back pain. EXAM: LUMBAR SPINE - COMPLETE 4+ VIEW COMPARISON:  Abdominopelvic CT reformats yesterday. FINDINGS: The alignment is maintained. Vertebral body heights are normal. There is no listhesis. The posterior elements are intact. Disc spaces are preserved. No fracture. Sacroiliac joints are symmetric and normal. IMPRESSION: Negative radiographs of the lumbar spine. Electronically Signed   By: Narda RutherfordMelanie  Sanford M.D.   On: 11/06/2018 02:30   MAU Course  Procedures  MDM -urinary hesitancy, beginning prior to positive pregnancy test -discussed with pt that given symptoms and timeline, this is unlikely to be a pregnancy-related issue and offered transfer to ED for further evaluation, pt agrees -pt offered cath in MAU to drain bladder and improve comfort and perform UA/urine culture, pt declines despite being aware that she may wait multiple hours in ED prior to being seen -vaginal discharge evaluated in MAU -WetPrep: +yeast (will send RX), +ClueCells, few WBCs -GC/CT collected -called and spoke with Dr. Pilar PlateBero (ED provider) @1145 , relayed patient information and provider states "we are here for her and happy to see her." -when provider returned to pt room to relay results of WetPrep, pt states that she no longer wishes to be transferred to ED for evaluation at this time and will return "if it gets worse." In-depth discussion with patient on possibilities for evaluation/treatment in ED and that provider strongly recommends that patient be seen in ED for evaluation of this concern. Pt and partner continue to decline, but state that they will present to ED later today "if it becomes a  problem." Pt and partner also then report that the patient is able to go more than 2 times a day when she is not working because she "sweats a lot at work." Pt offered catheterization prior to going home for UA/urine culture evaluation and relief of symptoms, but pt declines. -Dr. Pilar PlateBero notified that patient no longer wishes to be seen in ED for evaluation @1212  -pt discharged to home in stable condition  Orders Placed This Encounter  Procedures  . Wet prep, genital    Standing Status:   Standing    Number of Occurrences:   1  . Discharge patient    Order Specific Question:   Discharge disposition    Answer:   01-Home or Self Care [1]    Order Specific Question:  Discharge patient date    Answer:   11/15/2018   Meds ordered this encounter  Medications  . terconazole (TERAZOL 7) 0.4 % vaginal cream    Sig: Place 1 applicator vaginally at bedtime for 7 days.    Dispense:  45 g    Refill:  0    Order Specific Question:   Supervising Provider    Answer:   Jaynie CollinsANYANWU, UGONNA A [[id number]]   Assessment and Plan   1. Urinary hesitancy   2. Paresthesia of both legs   3. Yeast infection   4. Vaginal itching    Allergies as of 11/15/2018      Reactions   Morphine And Related    Grandfather died from morphine States father had a severe reaction also    Latex Rash      Medication List    STOP taking these medications   Aleve 220 MG Caps Generic drug: Naproxen Sodium     TAKE these medications   terconazole 0.4 % vaginal cream Commonly known as: TERAZOL 7 Place 1 applicator vaginally at bedtime for 7 days.      -will call with culture results, if positive -RX sent for terconazole -list of safe meds in pregnancy given -please contact the urology office to schedule an appointment tomorrow -please use your home catheter, as prescribed, for prevention of urinary retention/stasis -continue using techniques such as running water and smelling peppermint oil, if aiding in urination -strict  VB/pain/N/V/return MAU precautions discussed -[facility], if needed -pt discharged to home in stable condition  Joni Reiningicole E Nugent 11/15/2018, 12:31 PM

## 2018-11-17 LAB — GC/CHLAMYDIA PROBE AMP (~~LOC~~) NOT AT ARMC
Chlamydia: NEGATIVE
Neisseria Gonorrhea: NEGATIVE

## 2018-11-26 DIAGNOSIS — R338 Other retention of urine: Secondary | ICD-10-CM | POA: Diagnosis not present

## 2018-11-26 DIAGNOSIS — R3911 Hesitancy of micturition: Secondary | ICD-10-CM | POA: Diagnosis not present

## 2018-11-26 DIAGNOSIS — R202 Paresthesia of skin: Secondary | ICD-10-CM | POA: Diagnosis not present

## 2018-11-30 DIAGNOSIS — E669 Obesity, unspecified: Secondary | ICD-10-CM | POA: Diagnosis not present

## 2018-11-30 DIAGNOSIS — N399 Disorder of urinary system, unspecified: Secondary | ICD-10-CM | POA: Diagnosis not present

## 2018-11-30 DIAGNOSIS — N911 Secondary amenorrhea: Secondary | ICD-10-CM | POA: Diagnosis not present

## 2018-12-02 ENCOUNTER — Encounter: Payer: Self-pay | Admitting: Neurology

## 2018-12-07 DIAGNOSIS — Z3480 Encounter for supervision of other normal pregnancy, unspecified trimester: Secondary | ICD-10-CM | POA: Diagnosis not present

## 2018-12-07 DIAGNOSIS — Z3685 Encounter for antenatal screening for Streptococcus B: Secondary | ICD-10-CM | POA: Diagnosis not present

## 2018-12-07 DIAGNOSIS — Z3481 Encounter for supervision of other normal pregnancy, first trimester: Secondary | ICD-10-CM | POA: Diagnosis not present

## 2018-12-07 LAB — OB RESULTS CONSOLE HIV ANTIBODY (ROUTINE TESTING): HIV: NONREACTIVE

## 2018-12-07 LAB — OB RESULTS CONSOLE ANTIBODY SCREEN: Antibody Screen: NEGATIVE

## 2018-12-07 LAB — OB RESULTS CONSOLE HEPATITIS B SURFACE ANTIGEN: Hepatitis B Surface Ag: NEGATIVE

## 2018-12-07 LAB — OB RESULTS CONSOLE ABO/RH: RH Type: POSITIVE

## 2018-12-07 LAB — OB RESULTS CONSOLE GC/CHLAMYDIA
Chlamydia: NEGATIVE
Gonorrhea: NEGATIVE

## 2018-12-07 LAB — OB RESULTS CONSOLE RPR: RPR: NONREACTIVE

## 2018-12-07 LAB — OB RESULTS CONSOLE RUBELLA ANTIBODY, IGM: Rubella: NON-IMMUNE/NOT IMMUNE

## 2018-12-22 DIAGNOSIS — Z124 Encounter for screening for malignant neoplasm of cervix: Secondary | ICD-10-CM | POA: Diagnosis not present

## 2018-12-22 DIAGNOSIS — Z113 Encounter for screening for infections with a predominantly sexual mode of transmission: Secondary | ICD-10-CM | POA: Diagnosis not present

## 2018-12-22 DIAGNOSIS — Z331 Pregnant state, incidental: Secondary | ICD-10-CM | POA: Diagnosis not present

## 2018-12-22 DIAGNOSIS — Z3401 Encounter for supervision of normal first pregnancy, first trimester: Secondary | ICD-10-CM | POA: Diagnosis not present

## 2018-12-23 DIAGNOSIS — Z124 Encounter for screening for malignant neoplasm of cervix: Secondary | ICD-10-CM | POA: Diagnosis not present

## 2018-12-23 DIAGNOSIS — Z331 Pregnant state, incidental: Secondary | ICD-10-CM | POA: Diagnosis not present

## 2018-12-27 NOTE — Progress Notes (Deleted)
NEUROLOGY CONSULTATION NOTE  Julia [name] MRN: [id number] DOB: 05/16/1985  Referring provider: Rhoderick Moodyhristopher Winter, MD Primary care provider: Nani Gasseratherine Metheney, MD  Reason for consult:  Evaluate for possible multiple sclerosis.  HISTORY OF PRESENT ILLNESS: Julia [name] is a 33 year old ***-handed white female who presents for evaluation of multiple sclerosis.  History supplemented by referring provider note. ***.  At the end of June, she fell on her tailbone ***.  Around July 4, she began experiencing intermittent paresthesias ***  CT abdomen and pelvis from 11/04/18 showed a 4 cm cyst on the left ovary but otherwise unremarkable.  She had lumbar plain films performed on 11/05/18, which were unremarkable.  There is a concern that her symptoms may be explained by multiple sclerosis because her brother was recently diagnosed with MS.    She recently found out that she is *** weeks pregnant.   PAST MEDICAL HISTORY: Past Medical History:  Diagnosis Date  . Acne     PAST SURGICAL HISTORY: Past Surgical History:  Procedure Laterality Date  . CHOLECYSTECTOMY  12/2008  . gallstone removal    . TONSILLECTOMY      MEDICATIONS: No current outpatient medications on file prior to visit.   No current facility-administered medications on file prior to visit.     ALLERGIES: Allergies  Allergen Reactions  . Morphine And Related     Grandfather died from morphine States father had a severe reaction also   . Latex Rash    FAMILY HISTORY: Family History  Problem Relation Age of Onset  . Asthma Mother   . Hypertension Mother   . Alcohol abuse Father   . Alcohol abuse Maternal Grandmother   . AAA (abdominal aortic aneurysm) Paternal Aunt   . AAA (abdominal aortic aneurysm) Paternal Grandmother     SOCIAL HISTORY: Social History   Socioeconomic History  . Marital status: Single    Spouse name: Not on file  . Number of children: Not on file  . Years of education: HS   . Highest education level: Not on file  Occupational History  . Occupation: shipping/Receiving clear    Comment: [employer]  Social Needs  . Financial resource strain: Not on file  . Food insecurity    Worry: Not on file    Inability: Not on file  . Transportation needs    Medical: Not on file    Non-medical: Not on file  Tobacco Use  . Smoking status: Never Smoker  . Smokeless tobacco: Never Used  Substance and Sexual Activity  . Alcohol use: Not Currently    Comment: 3 x monthly  . Drug use: No  . Sexual activity: Yes    Partners: Male  Lifestyle  . Physical activity    Days per week: Not on file    Minutes per session: Not on file  . Stress: Not on file  Relationships  . Social Musicianconnections    Talks on phone: Not on file    Gets together: Not on file    Attends religious service: Not on file    Active member of club or organization: Not on file    Attends meetings of clubs or organizations: Not on file    Relationship status: Not on file  . Intimate partner violence    Fear of current or ex partner: Not on file    Emotionally abused: Not on file    Physically abused: Not on file    Forced sexual activity: Not on file  Other Topics Concern  . Not on file  Social History Narrative   2 bottles of soda per day.  No regular exericise.  Lives with her mother and brother.    REVIEW OF SYSTEMS: Constitutional: No fevers, chills, or sweats, no generalized fatigue, change in appetite Eyes: No visual changes, double vision, eye pain Ear, nose and throat: No hearing loss, ear pain, nasal congestion, sore throat Cardiovascular: No chest pain, palpitations Respiratory:  No shortness of breath at rest or with exertion, wheezes GastrointestinaI: No nausea, vomiting, diarrhea, abdominal pain, fecal incontinence Genitourinary:  No dysuria, urinary retention or frequency Musculoskeletal:  No neck pain, back pain Integumentary: No rash, pruritus, skin lesions Neurological: as above  Psychiatric: No depression, insomnia, anxiety Endocrine: No palpitations, fatigue, diaphoresis, mood swings, change in appetite, change in weight, increased thirst Hematologic/Lymphatic:  No purpura, petechiae. Allergic/Immunologic: no itchy/runny eyes, nasal congestion, recent allergic reactions, rashes  PHYSICAL EXAM: *** General: No acute distress.  Patient appears well-groomed Head:  Normocephalic/atraumatic Eyes:  fundi examined but not visualized Neck: supple, no paraspinal tenderness, full range of motion Back: No paraspinal tenderness Heart: regular rate and rhythm Lungs: Clear to auscultation bilaterally. Vascular: No carotid bruits. Neurological Exam: Mental status: alert and oriented to person, place, and time, recent and remote memory intact, fund of knowledge intact, attention and concentration intact, speech fluent and not dysarthric, language intact. Cranial nerves: CN I: not tested CN II: pupils equal, round and reactive to light, visual fields intact CN III, IV, VI:  full range of motion, no nystagmus, no ptosis CN V: facial sensation intact CN VII: upper and lower face symmetric CN VIII: hearing intact CN IX, X: gag intact, uvula midline CN XI: sternocleidomastoid and trapezius muscles intact CN XII: tongue midline Bulk & Tone: normal, no fasciculations. Motor:  5/5 throughout *** Sensation:  Pinprick *** temperature *** and vibration sensation intact.  ***. Deep Tendon Reflexes:  2+ throughout, *** toes downgoing.  *** Finger to nose testing:  Without dysmetria.  *** Heel to shin:  Without dysmetria.  *** Gait:  Normal station and stride.  Able to turn and tandem walk. Romberg ***.  IMPRESSION: ***  PLAN: ***  Thank you for allowing me to take part in the care of this patient.  Metta Clines, DO  CC:  Ellison Hughs, MD  Beatrice Lecher, MD

## 2018-12-29 ENCOUNTER — Ambulatory Visit: Payer: BC Managed Care – PPO | Admitting: Neurology

## 2019-01-04 DIAGNOSIS — Z3682 Encounter for antenatal screening for nuchal translucency: Secondary | ICD-10-CM | POA: Diagnosis not present

## 2019-01-04 DIAGNOSIS — Z36 Encounter for antenatal screening for chromosomal anomalies: Secondary | ICD-10-CM | POA: Diagnosis not present

## 2019-01-04 DIAGNOSIS — Z3A12 12 weeks gestation of pregnancy: Secondary | ICD-10-CM | POA: Diagnosis not present

## 2019-01-18 DIAGNOSIS — R87610 Atypical squamous cells of undetermined significance on cytologic smear of cervix (ASC-US): Secondary | ICD-10-CM | POA: Diagnosis not present

## 2019-01-18 DIAGNOSIS — R87810 Cervical high risk human papillomavirus (HPV) DNA test positive: Secondary | ICD-10-CM | POA: Diagnosis not present

## 2019-02-02 DIAGNOSIS — J029 Acute pharyngitis, unspecified: Secondary | ICD-10-CM | POA: Diagnosis not present

## 2019-02-03 ENCOUNTER — Encounter: Payer: Self-pay | Admitting: Family Medicine

## 2019-02-03 ENCOUNTER — Ambulatory Visit (INDEPENDENT_AMBULATORY_CARE_PROVIDER_SITE_OTHER): Payer: BC Managed Care – PPO | Admitting: Family Medicine

## 2019-02-03 VITALS — Temp 99.2°F

## 2019-02-03 DIAGNOSIS — J069 Acute upper respiratory infection, unspecified: Secondary | ICD-10-CM | POA: Diagnosis not present

## 2019-02-03 NOTE — Progress Notes (Signed)
Virtual Visit via Telephone Note  I connected with Julia [name] on 02/03/19 at 10:10 AM EDT by telephone and verified that I am speaking with the correct person using two identifiers.   I discussed the limitations, risks, security and privacy concerns of performing an evaluation and management service by telephone and the availability of in person appointments. I also discussed with the patient that there may be a patient responsible charge related to this service. The patient expressed understanding and agreed to proceed.    Acute Office Visit  Subjective:    Patient ID: Julia [name], female    DOB: May 18, 1985, 33 y.o.   MRN: [id number]  Chief Complaint  Patient presents with  . Sore Throat    HPI Patient is a 16-week pregnant female being evaluated today for cold sxs.  Started with a sore throat 2 days ago and noticed a white spot on her throat. Pt stated that she went to [facility] last night and they did a rapid strep this was negative. She stated that they sent a specimen off for a culture.  She informed me that around 8 or 9 pm she began to have sxs of stuffy nose,cough,and sinus drainage (clear).  She denies any f/s/c/n/v/d. Hurt doesn't hurt as bad as it did.  HA behind her eyes yesterday. Had diarrhea once last night. No vomiting.   She is currently pregnant and stated that she can take Robitussin CF and she is due for her next dose at 1130 am. She feels like it is all in her nose.  No bodyaches.    Past Medical History:  Diagnosis Date  . Acne     Past Surgical History:  Procedure Laterality Date  . CHOLECYSTECTOMY  12/2008  . gallstone removal    . TONSILLECTOMY      Family History  Problem Relation Age of Onset  . Asthma Mother   . Hypertension Mother   . Alcohol abuse Father   . Alcohol abuse Maternal Grandmother   . AAA (abdominal aortic aneurysm) Paternal Aunt   . AAA (abdominal aortic aneurysm) Paternal Grandmother     Social History    Socioeconomic History  . Marital status: Single    Spouse name: Not on file  . Number of children: Not on file  . Years of education: HS  . Highest education level: Not on file  Occupational History  . Occupation: shipping/Receiving clear    Comment: [employer]  Social Needs  . Financial resource strain: Not on file  . Food insecurity    Worry: Not on file    Inability: Not on file  . Transportation needs    Medical: Not on file    Non-medical: Not on file  Tobacco Use  . Smoking status: Never Smoker  . Smokeless tobacco: Never Used  Substance and Sexual Activity  . Alcohol use: Not Currently    Comment: 3 x monthly  . Drug use: No  . Sexual activity: Yes    Partners: Male  Lifestyle  . Physical activity    Days per week: Not on file    Minutes per session: Not on file  . Stress: Not on file  Relationships  . Social Musician on phone: Not on file    Gets together: Not on file    Attends religious service: Not on file    Active member of club or organization: Not on file    Attends meetings of clubs  or organizations: Not on file    Relationship status: Not on file  . Intimate partner violence    Fear of current or ex partner: Not on file    Emotionally abused: Not on file    Physically abused: Not on file    Forced sexual activity: Not on file  Other Topics Concern  . Not on file  Social History Narrative   2 bottles of soda per day.  No regular exericise.  Lives with her mother and brother.    Outpatient Medications Prior to Visit  Medication Sig Dispense Refill  . Prenatal Vit-Fe Fumarate-FA (PRENATAL VITAMIN PO) Take by mouth.     No facility-administered medications prior to visit.     Allergies  Allergen Reactions  . Morphine And Related     Grandfather died from morphine States father had a severe reaction also   . Latex Rash    ROS     Objective:    Physical Exam  HENT:  She sounds like she has nasal congestin. No  Coughing  while on the phone.   Pulmonary/Chest: Effort normal.    Temp 99.2 F (37.3 C) (Axillary)   LMP 10/10/2018  Wt Readings from Last 3 Encounters:  11/15/18 255 lb 14.4 oz (116.1 kg)  11/05/18 246 lb (111.6 kg)  11/04/18 255 lb (115.7 kg)    Health Maintenance Due  Topic Date Due  . HIV Screening  11/16/2001  . PAP SMEAR-Modifier  11/06/2018  . INFLUENZA VACCINE  11/14/2018    There are no preventive care reminders to display for this patient.   No results found for: TSH Lab Results  Component Value Date   WBC 7.6 11/04/2018   HGB 14.5 11/04/2018   HCT 45.1 11/04/2018   MCV 88.4 11/04/2018   PLT 203 11/04/2018   Lab Results  Component Value Date   NA 137 11/04/2018   K 3.6 11/04/2018   CO2 22 11/04/2018   GLUCOSE 86 11/04/2018   BUN 15 11/04/2018   CREATININE 0.78 11/04/2018   BILITOT 0.7 11/04/2018   ALKPHOS 46 11/04/2018   AST 20 11/04/2018   ALT 32 11/04/2018   PROT 7.8 11/04/2018   ALBUMIN 4.3 11/04/2018   CALCIUM 9.1 11/04/2018   ANIONGAP 12 11/04/2018   No results found for: CHOL No results found for: HDL No results found for: LDLCALC No results found for: TRIG No results found for: CHOLHDL No results found for: HGBA1C     Assessment & Plan:   Problem List Items Addressed This Visit    None    Visit Diagnoses    Viral upper respiratory tract infection    -  Primary     Upper respiratory infection versus early sinusitis.  At this point she is really only had symptoms for about 3 days but it did start with a sore throat.  Now her throat is better but she has cough and nasal congestion she just feels very congested.  Okay to continue her Robitussin which is on her list of safe medications.  Okay to use nasal saline in her nose.  And okay to use Flonase for a short period of time.  If after a week she is not significantly improved within encouraged her to call back and we will treat for potential sinus infection.  Explained that it does sound viral.   Also discussed that this could be Covid and that she might want to consider getting evaluated or tested.  She does not feel  like she is had any potential exposures and wants to hold off for now.  She did get tested for strep which was negative but culture is still pending.  Call if any new or worsening symptoms or develops fever greater than 100.5.    No orders of the defined types were placed in this encounter.    I discussed the assessment and treatment plan with the patient. The patient was provided an opportunity to ask questions and all were answered. The patient agreed with the plan and demonstrated an understanding of the instructions.   The patient was advised to call back or seek an in-person evaluation if the symptoms worsen or if the condition fails to improve as anticipated.  I provided 15 minutes of non-face-to-face time during this encounter.   Nani Gasseratherine Yerlin Gasparyan, MD

## 2019-02-03 NOTE — Progress Notes (Signed)
Pt stated that she [facility] and they did a rapid strep this was negative. She stated that they sent a specimen off for a culture.  She informed me that around 8 or 9 pm she began to have sxs of stuffy nose,cough,and sinus drainage (clear).  She denies any f/s/c/n/v/d.   She is currently pregnant and stated that she can take Robitussin CF and she is due for her next dose at 1130 am.   .Elouise Munroe, [place]

## 2019-02-16 DIAGNOSIS — Z361 Encounter for antenatal screening for raised alphafetoprotein level: Secondary | ICD-10-CM | POA: Diagnosis not present

## 2019-02-16 DIAGNOSIS — Z363 Encounter for antenatal screening for malformations: Secondary | ICD-10-CM | POA: Diagnosis not present

## 2019-02-16 DIAGNOSIS — Z3A18 18 weeks gestation of pregnancy: Secondary | ICD-10-CM | POA: Diagnosis not present

## 2019-03-16 DIAGNOSIS — Z362 Encounter for other antenatal screening follow-up: Secondary | ICD-10-CM | POA: Diagnosis not present

## 2019-03-16 DIAGNOSIS — Z3A22 22 weeks gestation of pregnancy: Secondary | ICD-10-CM | POA: Diagnosis not present

## 2019-04-15 DIAGNOSIS — Z23 Encounter for immunization: Secondary | ICD-10-CM | POA: Diagnosis not present

## 2019-04-15 DIAGNOSIS — Z3480 Encounter for supervision of other normal pregnancy, unspecified trimester: Secondary | ICD-10-CM | POA: Diagnosis not present

## 2019-04-16 NOTE — L&D Delivery Note (Signed)
Vtx at +3 station and roa.  Pt desires VE.  I discussed the R&B of VE including but not limited to injury to fetus but the potential benefit of expedited delivery.  She gives her informed consent and wishes to proceed. On the 3rd pull delivered viable female apgars 9,9 over 2nd degree ML lac.  Nuchal x 1 reduced on perineum   Placenta delivered spontaneously intact with 3VC. Repair with 2-0 Chromic with good support and hemostasis noted and R/V exam confirms.  PH art was sent. .  Mother and baby were doing well.  EBL 200cc Will cont unasyn x 24 h due to chorio  Candice Camp, MD

## 2019-06-06 ENCOUNTER — Encounter (HOSPITAL_COMMUNITY): Payer: Self-pay | Admitting: Obstetrics & Gynecology

## 2019-06-06 ENCOUNTER — Inpatient Hospital Stay (HOSPITAL_COMMUNITY)
Admission: AD | Admit: 2019-06-06 | Discharge: 2019-06-06 | Disposition: A | Discharge status: Home / Self Care | Payer: BC Managed Care – PPO | Attending: Obstetrics & Gynecology | Admitting: Obstetrics & Gynecology

## 2019-06-06 ENCOUNTER — Other Ambulatory Visit: Payer: Self-pay

## 2019-06-06 DIAGNOSIS — R109 Unspecified abdominal pain: Secondary | ICD-10-CM

## 2019-06-06 DIAGNOSIS — Z91040 Latex allergy status: Secondary | ICD-10-CM | POA: Diagnosis not present

## 2019-06-06 DIAGNOSIS — Z885 Allergy status to narcotic agent status: Secondary | ICD-10-CM | POA: Diagnosis not present

## 2019-06-06 DIAGNOSIS — Z3A34 34 weeks gestation of pregnancy: Secondary | ICD-10-CM | POA: Diagnosis not present

## 2019-06-06 DIAGNOSIS — R030 Elevated blood-pressure reading, without diagnosis of hypertension: Secondary | ICD-10-CM | POA: Diagnosis not present

## 2019-06-06 DIAGNOSIS — Z825 Family history of asthma and other chronic lower respiratory diseases: Secondary | ICD-10-CM | POA: Insufficient documentation

## 2019-06-06 DIAGNOSIS — Z8249 Family history of ischemic heart disease and other diseases of the circulatory system: Secondary | ICD-10-CM | POA: Diagnosis not present

## 2019-06-06 DIAGNOSIS — O26893 Other specified pregnancy related conditions, third trimester: Secondary | ICD-10-CM | POA: Diagnosis not present

## 2019-06-06 DIAGNOSIS — O26899 Other specified pregnancy related conditions, unspecified trimester: Secondary | ICD-10-CM

## 2019-06-06 DIAGNOSIS — O169 Unspecified maternal hypertension, unspecified trimester: Secondary | ICD-10-CM

## 2019-06-06 LAB — COMPREHENSIVE METABOLIC PANEL
ALT: 11 U/L (ref 0–44)
AST: 13 U/L — ABNORMAL LOW (ref 15–41)
Albumin: 2.6 g/dL — ABNORMAL LOW (ref 3.5–5.0)
Alkaline Phosphatase: 87 U/L (ref 38–126)
Anion gap: 8 (ref 5–15)
BUN: 7 mg/dL (ref 6–20)
CO2: 20 mmol/L — ABNORMAL LOW (ref 22–32)
Calcium: 9 mg/dL (ref 8.9–10.3)
Chloride: 106 mmol/L (ref 98–111)
Creatinine, Ser: 0.71 mg/dL (ref 0.44–1.00)
GFR calc Af Amer: >60 mL/min (ref >60)
GFR calc non Af Amer: >60 mL/min (ref >60)
Glucose, Bld: 108 mg/dL — ABNORMAL HIGH (ref 70–99)
Potassium: 3.7 mmol/L (ref 3.5–5.1)
Sodium: 134 mmol/L — ABNORMAL LOW (ref 135–145)
Total Bilirubin: <0.1 mg/dL — ABNORMAL LOW (ref 0.3–1.2)
Total Protein: 6 g/dL — ABNORMAL LOW (ref 6.5–8.1)

## 2019-06-06 LAB — CBC
HCT: 37 % (ref 36.0–46.0)
Hemoglobin: 12.4 g/dL (ref 12.0–15.0)
MCH: 28.7 pg (ref 26.0–34.0)
MCHC: 33.5 g/dL (ref 30.0–36.0)
MCV: 85.6 fL (ref 80.0–100.0)
Platelets: 192 10*3/uL (ref 150–400)
RBC: 4.32 MIL/uL (ref 3.87–5.11)
RDW: 13.3 % (ref 11.5–15.5)
WBC: 8.5 10*3/uL (ref 4.0–10.5)
nRBC: 0 % (ref 0.0–0.2)

## 2019-06-06 LAB — WET PREP, GENITAL
Clue Cells Wet Prep HPF POC: NONE SEEN
Sperm: NONE SEEN
Trich, Wet Prep: NONE SEEN
Yeast Wet Prep HPF POC: NONE SEEN

## 2019-06-06 LAB — PROTEIN / CREATININE RATIO, URINE
Creatinine, Urine: 191.72 mg/dL
Protein Creatinine Ratio: 0.08 mg/mg{Cre} (ref 0.00–0.15)
Total Protein, Urine: 16 mg/dL

## 2019-06-06 LAB — URINALYSIS, ROUTINE W REFLEX MICROSCOPIC
Bilirubin Urine: NEGATIVE
Glucose, UA: NEGATIVE mg/dL
Hgb urine dipstick: NEGATIVE
Ketones, ur: NEGATIVE mg/dL
Leukocytes,Ua: NEGATIVE
Nitrite: NEGATIVE
Protein, ur: NEGATIVE mg/dL
Specific Gravity, Urine: 1.023 (ref 1.005–1.030)
pH: 6 (ref 5.0–8.0)

## 2019-06-06 MED ORDER — CYCLOBENZAPRINE HCL 5 MG PO TABS
5.0000 mg | ORAL_TABLET | Freq: Once | ORAL | Status: AC
  Administered 2019-06-06: 5 mg via ORAL
  Filled 2019-06-06: qty 1

## 2019-06-06 NOTE — MAU Note (Signed)
Julia [name] is a 33 y.o. at [redacted]w[redacted]d here in MAU reporting:  +nausea +increase in discharge. Notices a wet spot on her chair after getting up from her chair at the restaurant.  +abdominal pain since yesterday. Became constant around 1 am. Worse with movement. Tried tylenol with no relief.  Pain score: 6-7/10   Lab orders placed from triage: ua. Patient unable to void at this time. Specimen cup left at bedside.

## 2019-06-06 NOTE — Discharge Instructions (Signed)
Braxton Hicks Contractions °Contractions of the uterus can occur throughout pregnancy, but they are not always a sign that you are in labor. You may have practice contractions called Braxton Hicks contractions. These false labor contractions are sometimes confused with true labor. °What are Braxton Hicks contractions? °Braxton Hicks contractions are tightening movements that occur in the muscles of the uterus before labor. Unlike true labor contractions, these contractions do not result in opening (dilation) and thinning of the cervix. Toward the end of pregnancy (32-34 weeks), Braxton Hicks contractions can happen more often and may become stronger. These contractions are sometimes difficult to tell apart from true labor because they can be very uncomfortable. You should not feel embarrassed if you go to the hospital with false labor. °Sometimes, the only way to tell if you are in true labor is for your health care provider to look for changes in the cervix. The health care provider will do a physical exam and may monitor your contractions. If you are not in true labor, the exam should show that your cervix is not dilating and your water has not broken. °If there are no other health problems associated with your pregnancy, it is completely safe for you to be sent home with false labor. You may continue to have Braxton Hicks contractions until you go into true labor. °How to tell the difference between true labor and false labor °True labor °· Contractions last 30-70 seconds. °· Contractions become very regular. °· Discomfort is usually felt in the top of the uterus, and it spreads to the lower abdomen and low back. °· Contractions do not go away with walking. °· Contractions usually become more intense and increase in frequency. °· The cervix dilates and gets thinner. °False labor °· Contractions are usually shorter and not as strong as true labor contractions. °· Contractions are usually irregular. °· Contractions  are often felt in the front of the lower abdomen and in the groin. °· Contractions may go away when you walk around or change positions while lying down. °· Contractions get weaker and are shorter-lasting as time goes on. °· The cervix usually does not dilate or become thin. °Follow these instructions at home: ° °· Take over-the-counter and prescription medicines only as told by your health care provider. °· Keep up with your usual exercises and follow other instructions from your health care provider. °· Eat and drink lightly if you think you are going into labor. °· If Braxton Hicks contractions are making you uncomfortable: °? Change your position from lying down or resting to walking, or change from walking to resting. °? Sit and rest in a tub of warm water. °? Drink enough fluid to keep your urine pale yellow. Dehydration may cause these contractions. °? Do slow and deep breathing several times an hour. °· Keep all follow-up prenatal visits as told by your health care provider. This is important. °Contact a health care provider if: °· You have a fever. °· You have continuous pain in your abdomen. °Get help right away if: °· Your contractions become stronger, more regular, and closer together. °· You have fluid leaking or gushing from your vagina. °· You pass blood-tinged mucus (bloody show). °· You have bleeding from your vagina. °· You have low back pain that you never had before. °· You feel your baby’s head pushing down and causing pelvic pressure. °· Your baby is not moving inside you as much as it used to. °Summary °· Contractions that occur before labor are   called Braxton Hicks contractions, false labor, or practice contractions. °· Braxton Hicks contractions are usually shorter, weaker, farther apart, and less regular than true labor contractions. True labor contractions usually become progressively stronger and regular, and they become more frequent. °· Manage discomfort from Braxton Hicks contractions  by changing position, resting in a warm bath, drinking plenty of water, or practicing deep breathing. °This information is not intended to replace advice given to you by your health care provider. Make sure you discuss any questions you have with your health care provider. °Document Revised: 03/14/2017 Document Reviewed: 08/15/2016 °Elsevier Patient Education © 2020 [employer] ° °Safe Medications in Pregnancy  ° °Acne: °Benzoyl Peroxide °Salicylic Acid ° °Backache/Headache: °Tylenol: 2 regular strength every 4 hours OR °             2 Extra strength every 6 hours ° °Colds/Coughs/Allergies: °Benadryl (alcohol free) 25 mg every 6 hours as needed °Breath right strips °Claritin °Cepacol throat lozenges °Chloraseptic throat spray °Cold-Eeze- up to three times per day °Cough drops, alcohol free °Flonase (by prescription only) °Guaifenesin °Mucinex °Robitussin DM (plain only, alcohol free) °Saline nasal spray/drops °Sudafed (pseudoephedrine) & Actifed ** use only after [redacted] weeks gestation and if you do not have high blood pressure °Tylenol °Vicks Vaporub °Zinc lozenges °Zyrtec  ° °Constipation: °Colace °Ducolax suppositories °Fleet enema °Glycerin suppositories °Metamucil °Milk of magnesia °Miralax °Senokot °Smooth move tea ° °Diarrhea: °Kaopectate °Imodium A-D ° °*NO pepto Bismol ° °Hemorrhoids: °Anusol °Anusol HC °Preparation H °Tucks ° °Indigestion: °Tums °Maalox °Mylanta °Zantac  °Pepcid ° °Insomnia: °Benadryl (alcohol free) 25mg every 6 hours as needed °Tylenol PM °Unisom, no Gelcaps ° °Leg Cramps: °Tums °MagGel ° °Nausea/Vomiting:  °Bonine °Dramamine °Emetrol °Ginger extract °Sea bands °Meclizine  °Nausea medication to take during pregnancy:  °Unisom (doxylamine succinate 25 mg tablets) Take one tablet daily at bedtime. If symptoms are not adequately controlled, the dose can be increased to a maximum recommended dose of two tablets daily (1/2 tablet in the morning, 1/2 tablet mid-afternoon and one at  bedtime). °Vitamin B6 100mg tablets. Take one tablet twice a day (up to 200 mg per day). ° °Skin Rashes: °Aveeno products °Benadryl cream or 25mg every 6 hours as needed °Calamine Lotion °1% cortisone cream ° °Yeast infection: °Gyne-lotrimin 7 °Monistat 7 ° ° °**If taking multiple medications, please check labels to avoid duplicating the same active ingredients °**take medication as directed on the label °** Do not exceed 4000 mg of tylenol in 24 hours °**Do not take medications that contain aspirin or ibuprofen ° ° ° ° °

## 2019-06-06 NOTE — MAU Provider Note (Signed)
History     CSN: [id number]  Arrival date and time: 06/06/19 1341   First Provider Initiated Contact with Patient 06/06/19 1446      Chief Complaint  Patient presents with  . Abdominal Pain  . Nausea   HPI Julia [name] is a 34 y.o. G1P0000 at [redacted]w[redacted]d who presents with abdominal pain and vaginal discharge. She states she was active at her baby shower yesterday and now has pain at her umbillicus. She rates the pain a 6/10 and had no resolution with tylenol. She also reports an increase in discharge that left her chair damp. She reports normal fetal movement. She denies any vaginal bleeding.   OB History    Gravida  1   Para  0   Term  0   Preterm  0   AB  0   Living  0     SAB  0   TAB  0   Ectopic  0   Multiple  0   Live Births              Past Medical History:  Diagnosis Date  . Acne     Past Surgical History:  Procedure Laterality Date  . CHOLECYSTECTOMY  12/2008  . gallstone removal    . TONSILLECTOMY      Family History  Problem Relation Age of Onset  . Asthma Mother   . Hypertension Mother   . Alcohol abuse Father   . Alcohol abuse Maternal Grandmother   . AAA (abdominal aortic aneurysm) Paternal Aunt   . AAA (abdominal aortic aneurysm) Paternal Grandmother     Social History   Tobacco Use  . Smoking status: Never Smoker  . Smokeless tobacco: Never Used  Substance Use Topics  . Alcohol use: Not Currently    Comment: 3 x monthly  . Drug use: No    Allergies:  Allergies  Allergen Reactions  . Morphine And Related     Grandfather died from morphine States father had a severe reaction also   . Latex Rash    Medications Prior to Admission  Medication Sig Dispense Refill Last Dose  . Prenatal Vit-Fe Fumarate-FA (PRENATAL VITAMIN PO) Take by mouth.   Past Week at Unknown time    Review of Systems  Constitutional: Negative.  Negative for fatigue and fever.  HENT: Negative.   Respiratory: Negative.  Negative for shortness of  breath.   Cardiovascular: Negative.  Negative for chest pain.  Gastrointestinal: Positive for abdominal pain. Negative for constipation, diarrhea, nausea and vomiting.  Genitourinary: Positive for vaginal discharge. Negative for dysuria and vaginal bleeding.  Neurological: Negative.  Negative for dizziness and headaches.   Physical Exam   Blood pressure 139/76, pulse 92, temperature 98.5 F (36.9 C), temperature source Oral, resp. rate 18, height 5\' 3"  (1.6 m), weight 132.5 kg, last menstrual period 10/10/2018.  Physical Exam  Nursing note and vitals reviewed. Constitutional: She is oriented to person, place, and time. She appears well-developed and well-nourished. No distress.  HENT:  Head: Normocephalic.  Eyes: Pupils are equal, round, and reactive to light.  Cardiovascular: Normal rate, regular rhythm and normal heart sounds.  Respiratory: Effort normal and breath sounds normal. No respiratory distress.  GI: Soft. Bowel sounds are normal. She exhibits no distension. There is no abdominal tenderness.  Genitourinary:    Genitourinary Comments: Pelvic exam: Cervix pink, visually closed, without lesion, scant white creamy discharge, vaginal walls and external genitalia normal Bimanual exam: Cervix 0/long/high, firm, anterior, neg  CMT, uterus nontender, adnexa without tenderness, enlargement, or mass    Neurological: She is alert and oriented to person, place, and time.  Skin: Skin is warm and dry.  Psychiatric: She has a normal mood and affect. Her behavior is normal. Judgment and thought content normal.   Dilation: Closed Effacement (%): Thick Cervical Position: Posterior Station: Ballotable Exam by:: Ma Hillock CNM  Fetal Tracing:  Baseline: 130 Variability: moderate Accels: 15x15 Decels: none  Toco: none   MAU Course  Procedures Results for orders placed or performed during the hospital encounter of 06/06/19 (from the past 24 hour(s))  CBC     Status: None   Collection  Time: 06/06/19  3:17 PM  Result Value Ref Range   WBC 8.5 4.0 - 10.5 K/uL   RBC 4.32 3.87 - 5.11 MIL/uL   Hemoglobin 12.4 12.0 - 15.0 g/dL   HCT 29.5 62.1 - 30.8 %   MCV 85.6 80.0 - 100.0 fL   MCH 28.7 26.0 - 34.0 pg   MCHC 33.5 30.0 - 36.0 g/dL   RDW 65.7 84.6 - 96.2 %   Platelets 192 150 - 400 K/uL   nRBC 0.0 0.0 - 0.2 %  Urinalysis, Routine w reflex microscopic     Status: Abnormal   Collection Time: 06/06/19  3:40 PM  Result Value Ref Range   Color, Urine YELLOW YELLOW   APPearance HAZY (A) CLEAR   Specific Gravity, Urine 1.023 1.005 - 1.030   pH 6.0 5.0 - 8.0   Glucose, UA NEGATIVE NEGATIVE mg/dL   Hgb urine dipstick NEGATIVE NEGATIVE   Bilirubin Urine NEGATIVE NEGATIVE   Ketones, ur NEGATIVE NEGATIVE mg/dL   Protein, ur NEGATIVE NEGATIVE mg/dL   Nitrite NEGATIVE NEGATIVE   Leukocytes,Ua NEGATIVE NEGATIVE  Wet prep, genital     Status: Abnormal   Collection Time: 06/06/19  3:50 PM  Result Value Ref Range   Yeast Wet Prep HPF POC NONE SEEN NONE SEEN   Trich, Wet Prep NONE SEEN NONE SEEN   Clue Cells Wet Prep HPF POC NONE SEEN NONE SEEN   WBC, Wet Prep HPF POC FEW (A) NONE SEEN   Sperm NONE SEEN    MDM UA CBC Wet prep, GC/Chlamydia Flexeril  Upon discharge vital signs, patient found to be hypertensive. Will get preeclampsia labs before discharge. Patient denies any HA, visual changes or epigastric pain.  Patient Vitals for the past 24 hrs:  BP Temp Temp src Pulse Resp Height Weight  06/06/19 1831 134/85 -- -- 90 16 -- --  06/06/19 1816 129/82 -- -- 92 16 -- --  06/06/19 1801 128/86 -- -- 91 14 -- --  06/06/19 1746 140/82 -- -- 84 16 -- --  06/06/19 1731 137/83 -- -- 85 16 -- --  06/06/19 1725 (!) 142/85 -- -- 85 18 -- --  06/06/19 1708 (!) 141/77 97.8 F (36.6 C) Oral 82 18 -- --  06/06/19 1422 139/76 98.5 F (36.9 C) Oral 92 18 5\' 3"  (1.6 m) 132.5 kg   Labs normal and patient asymptomatic. Reviewed preeclampsia precautions at length. Left message with  office for close follow up this week.    Assessment and Plan   1. Abdominal pain affecting pregnancy   2. [redacted] weeks gestation of pregnancy   3. Elevated blood pressure affecting pregnancy, antepartum    -Discharge home in stable condition -Preeclampsia precautions discussed -Patient advised to follow-up with [facility] on Thursday as scheduled -Patient may return to MAU as needed  or if her condition were to change or worsen   Wende Mott CNM 06/06/2019, 2:46 PM

## 2019-06-07 LAB — GC/CHLAMYDIA PROBE AMP (~~LOC~~) NOT AT ARMC
Chlamydia: NEGATIVE
Comment: NEGATIVE
Comment: NORMAL
Neisseria Gonorrhea: NEGATIVE

## 2019-06-08 ENCOUNTER — Inpatient Hospital Stay (HOSPITAL_COMMUNITY)
Admission: AD | Admit: 2019-06-08 | Discharge: 2019-06-08 | Disposition: A | Discharge status: Home / Self Care | Payer: BC Managed Care – PPO | Attending: Obstetrics and Gynecology | Admitting: Obstetrics and Gynecology

## 2019-06-08 ENCOUNTER — Encounter (HOSPITAL_COMMUNITY): Payer: Self-pay | Admitting: Obstetrics and Gynecology

## 2019-06-08 DIAGNOSIS — Z3A34 34 weeks gestation of pregnancy: Secondary | ICD-10-CM | POA: Insufficient documentation

## 2019-06-08 DIAGNOSIS — Z8249 Family history of ischemic heart disease and other diseases of the circulatory system: Secondary | ICD-10-CM | POA: Diagnosis not present

## 2019-06-08 DIAGNOSIS — O26893 Other specified pregnancy related conditions, third trimester: Secondary | ICD-10-CM

## 2019-06-08 DIAGNOSIS — O163 Unspecified maternal hypertension, third trimester: Secondary | ICD-10-CM | POA: Diagnosis not present

## 2019-06-08 DIAGNOSIS — R519 Headache, unspecified: Secondary | ICD-10-CM | POA: Insufficient documentation

## 2019-06-08 DIAGNOSIS — Z885 Allergy status to narcotic agent status: Secondary | ICD-10-CM | POA: Diagnosis not present

## 2019-06-08 DIAGNOSIS — O133 Gestational [pregnancy-induced] hypertension without significant proteinuria, third trimester: Secondary | ICD-10-CM | POA: Insufficient documentation

## 2019-06-08 LAB — CBC
HCT: 36.2 % (ref 36.0–46.0)
Hemoglobin: 12 g/dL (ref 12.0–15.0)
MCH: 28.8 pg (ref 26.0–34.0)
MCHC: 33.1 g/dL (ref 30.0–36.0)
MCV: 86.8 fL (ref 80.0–100.0)
Platelets: 185 10*3/uL (ref 150–400)
RBC: 4.17 MIL/uL (ref 3.87–5.11)
RDW: 13.5 % (ref 11.5–15.5)
WBC: 9 10*3/uL (ref 4.0–10.5)
nRBC: 0 % (ref 0.0–0.2)

## 2019-06-08 LAB — COMPREHENSIVE METABOLIC PANEL
ALT: 13 U/L (ref 0–44)
AST: 6 U/L — ABNORMAL LOW (ref 15–41)
Albumin: 2.8 g/dL — ABNORMAL LOW (ref 3.5–5.0)
Alkaline Phosphatase: 88 U/L (ref 38–126)
Anion gap: 10 (ref 5–15)
BUN: <5 mg/dL — ABNORMAL LOW (ref 6–20)
CO2: 19 mmol/L — ABNORMAL LOW (ref 22–32)
Calcium: 9.3 mg/dL (ref 8.9–10.3)
Chloride: 105 mmol/L (ref 98–111)
Creatinine, Ser: 0.52 mg/dL (ref 0.44–1.00)
GFR calc Af Amer: >60 mL/min (ref >60)
GFR calc non Af Amer: >60 mL/min (ref >60)
Glucose, Bld: 129 mg/dL — ABNORMAL HIGH (ref 70–99)
Potassium: 3.6 mmol/L (ref 3.5–5.1)
Sodium: 134 mmol/L — ABNORMAL LOW (ref 135–145)
Total Bilirubin: 0.4 mg/dL (ref 0.3–1.2)
Total Protein: 6.6 g/dL (ref 6.5–8.1)

## 2019-06-08 LAB — URINALYSIS, ROUTINE W REFLEX MICROSCOPIC
Bilirubin Urine: NEGATIVE
Glucose, UA: NEGATIVE mg/dL
Hgb urine dipstick: NEGATIVE
Ketones, ur: NEGATIVE mg/dL
Leukocytes,Ua: NEGATIVE
Nitrite: NEGATIVE
Protein, ur: NEGATIVE mg/dL
Specific Gravity, Urine: 1.008 (ref 1.005–1.030)
pH: 5 (ref 5.0–8.0)

## 2019-06-08 LAB — PROTEIN / CREATININE RATIO, URINE
Creatinine, Urine: 68.69 mg/dL
Protein Creatinine Ratio: 0.09 mg/mg{Cre} (ref 0.00–0.15)
Total Protein, Urine: 6 mg/dL

## 2019-06-08 MED ORDER — BUTALBITAL-APAP-CAFFEINE 50-325-40 MG PO TABS
2.0000 | ORAL_TABLET | Freq: Once | ORAL | Status: AC
  Administered 2019-06-08: 23:00:00 2 via ORAL
  Filled 2019-06-08: qty 2

## 2019-06-08 NOTE — MAU Note (Signed)
Was seen Sunday for HTN-told to come back if diastolic >90.  Took BP tonight at work due to feeling light headed/dizzy and it was 147/110 and 151/105.  Reports seeing black spots in her vision and a HA (4/10).  Took tylenol 2 hours ago for the HA without relief.  Denies epigastric pain.  Feels some abdominal tightening-irregular.  Denies VB/LOF.

## 2019-06-08 NOTE — MAU Provider Note (Signed)
Chief Complaint:  Hypertension and Headache   First Provider Initiated Contact with Patient 06/08/19 2100     HPI: Julia [name] is a 34 y.o. G1P0000 at 70w3dwho presents to maternity admissions reporting elevated blood pressure at work tonight.  C/O light-headedness, headache and visual changes (black spots) when dizzy spell happens..  Tylenol did not help  Had some hypertension when she was here on Sunday also. She reports good fetal movement, denies LOF, vaginal bleeding, vaginal itching/burning, urinary symptoms, h/a, dizziness, n/v, diarrhea, constipation or fever/chills.  She denies headache, visual changes or RUQ abdominal pain.  Hypertension This is a new problem. The current episode started in the past 7 days. The problem has been gradually worsening since onset. Associated symptoms include headaches and peripheral edema. Pertinent negatives include no anxiety, blurred vision (but sees black spots when dizzy), malaise/fatigue, orthopnea or shortness of breath. There are no associated agents to hypertension. There are no known risk factors for coronary artery disease. Past treatments include nothing. There are no compliance problems.   Headache  This is a new problem. The current episode started today. The problem occurs constantly. The problem has been unchanged. The quality of the pain is described as aching. Associated symptoms include a visual change. Pertinent negatives include no abdominal pain, back pain, blurred vision (but sees black spots when dizzy), dizziness, nausea, photophobia, tingling, vomiting or weakness. Nothing aggravates the symptoms. She has tried acetaminophen for the symptoms. The treatment provided no relief. Her past medical history is significant for hypertension.   RN note: Was seen Sunday for HTN-told to come back if diastolic >90.  Took BP tonight at work due to feeling light headed/dizzy and it was 147/110 and 151/105.  Reports seeing black spots in her vision  and a HA (4/10).  Took tylenol 2 hours ago for the HA without relief.  Denies epigastric pain.  Feels some abdominal tightening-irregular.  Denies VB/LOF.   Past Medical History: Past Medical History:  Diagnosis Date  . Acne     Past obstetric history: OB History  Gravida Para Term Preterm AB Living  1 0 0 0 0 0  SAB TAB Ectopic Multiple Live Births  0 0 0 0      # Outcome Date GA Lbr Len/2nd Weight Sex Delivery Anes PTL Lv  1 Current             Past Surgical History: Past Surgical History:  Procedure Laterality Date  . CHOLECYSTECTOMY  12/2008  . gallstone removal    . TONSILLECTOMY      Family History: Family History  Problem Relation Age of Onset  . Asthma Mother   . Hypertension Mother   . Alcohol abuse Father   . Alcohol abuse Maternal Grandmother   . AAA (abdominal aortic aneurysm) Paternal Aunt   . AAA (abdominal aortic aneurysm) Paternal Grandmother     Social History: Social History   Tobacco Use  . Smoking status: Never Smoker  . Smokeless tobacco: Never Used  Substance Use Topics  . Alcohol use: Not Currently    Comment: 3 x monthly  . Drug use: No    Allergies:  Allergies  Allergen Reactions  . Morphine And Related     Grandfather died from morphine States father had a severe reaction also   . Latex Rash    Meds:  Medications Prior to Admission  Medication Sig Dispense Refill Last Dose  . Prenatal Vit-Fe Fumarate-FA (PRENATAL VITAMIN PO) Take by mouth.  I have reviewed patient's Past Medical Hx, Surgical Hx, Family Hx, Social Hx, medications and allergies.   ROS:  Review of Systems  Constitutional: Negative for malaise/fatigue.  Eyes: Negative for blurred vision (but sees black spots when dizzy) and photophobia.  Respiratory: Negative for shortness of breath.   Cardiovascular: Negative for orthopnea.  Gastrointestinal: Negative for abdominal pain, nausea and vomiting.  Musculoskeletal: Negative for back pain.  Neurological:  Positive for headaches. Negative for dizziness, tingling and weakness.   Other systems negative  Physical Exam   Patient Vitals for the past 24 hrs:  BP Pulse Resp Weight  06/08/19 2058 (!) 130/95 95 20 --  06/08/19 2055 -- -- -- 132.9 kg   06/08/19 2231  --  76  --  --  118/74  --  --  --  --  --  -- AC   06/08/19 2216  --  90  --  --  114/76  --  --  --  --  --  -- [facility]   06/08/19 2201  --  84  --  --  124/80  --  --  --  --  --  -- [facility]   06/08/19 2146  --  91  --  --  131/83  --  --  --  --  --  -- [facility]   06/08/19 2131  --  92  --  --  134/88  --  --  --  --  --  -- [facility]   06/08/19 2116  --  92  --  --  129/82  --  --  --  --  --  -- [facility]   06/08/19 2111  --  92  --  --  135/82                  Vitals:   06/08/19 2246 06/08/19 2301 06/08/19 2316 06/08/19 2345  BP: 122/77 119/68 125/66   Pulse: 91 80 86   Resp:    18  Temp:      TempSrc:      Weight:        Constitutional: Well-developed, well-nourished female in no acute distress.  Cardiovascular: normal rate and rhythm Respiratory: normal effort, clear to auscultation bilaterally GI: Abd soft, non-tender, gravid appropriate for gestational age.   No rebound or guarding. MS: Extremities nontender, Trace edema, normal ROM Neurologic: Alert and oriented x 4. DTR 2+ with no clonus GU: Neg CVAT.  PELVIC EXAM: deferred   FHT:  Baseline 125-130 , moderate variability, accelerations present, no decelerations Contractions: Occasional and Irregular     Labs: Results for orders placed or performed during the hospital encounter of 06/08/19 (from the past 24 hour(s))  CBC     Status: None   Collection Time: 06/08/19  8:57 PM  Result Value Ref Range   WBC 9.0 4.0 - 10.5 K/uL   RBC 4.17 3.87 - 5.11 MIL/uL   Hemoglobin 12.0 12.0 - 15.0 g/dL   HCT 20.9 47.0 - 96.2 %   MCV 86.8 80.0 - 100.0 fL   MCH 28.8 26.0 - 34.0 pg   MCHC 33.1 30.0 - 36.0 g/dL   RDW 83.6 62.9 - 47.6 %   Platelets 185 150 - 400 K/uL   nRBC 0.0 0.0 - 0.2 %   Comprehensive metabolic panel     Status: Abnormal   Collection Time: 06/08/19  8:57 PM  Result Value Ref Range   Sodium 134 (L) 135 - 145 mmol/L   Potassium  3.6 3.5 - 5.1 mmol/L   Chloride 105 98 - 111 mmol/L   CO2 19 (L) 22 - 32 mmol/L   Glucose, Bld 129 (H) 70 - 99 mg/dL   BUN <5 (L) 6 - 20 mg/dL   Creatinine, Ser 0.52 0.44 - 1.00 mg/dL   Calcium 9.3 8.9 - 10.3 mg/dL   Total Protein 6.6 6.5 - 8.1 g/dL   Albumin 2.8 (L) 3.5 - 5.0 g/dL   AST 6 (L) 15 - 41 U/L   ALT 13 0 - 44 U/L   Alkaline Phosphatase 88 38 - 126 U/L   Total Bilirubin 0.4 0.3 - 1.2 mg/dL   GFR calc non Af Amer >60 >60 mL/min   GFR calc Af Amer >60 >60 mL/min   Anion gap 10 5 - 15  Urinalysis, Routine w reflex microscopic     Status: None   Collection Time: 06/08/19  9:13 PM  Result Value Ref Range   Color, Urine YELLOW YELLOW   APPearance CLEAR CLEAR   Specific Gravity, Urine 1.008 1.005 - 1.030   pH 5.0 5.0 - 8.0   Glucose, UA NEGATIVE NEGATIVE mg/dL   Hgb urine dipstick NEGATIVE NEGATIVE   Bilirubin Urine NEGATIVE NEGATIVE   Ketones, ur NEGATIVE NEGATIVE mg/dL   Protein, ur NEGATIVE NEGATIVE mg/dL   Nitrite NEGATIVE NEGATIVE   Leukocytes,Ua NEGATIVE NEGATIVE  Protein / creatinine ratio, urine     Status: None   Collection Time: 06/08/19  9:13 PM  Result Value Ref Range   Creatinine, Urine 68.69 mg/dL   Total Protein, Urine 6 mg/dL   Protein Creatinine Ratio 0.09 0.00 - 0.15 mg/mg[Cre]     Imaging:  No results found.  MAU Course/MDM: I have ordered labs and reviewed results. Labs are all within normal limits. NST reviewed, reactive.  Consult Dr Nehemiah Settle with presentation, exam findings and test results.  Treatments in MAU included Fioricet for headache which relieved her headache entirely.  All but first BP readings were within normal limits..   Discussed range of Gestational hypertension through preeclampsia She is likely in the beginning stages of gestational hypertension. Encouraged to call  office in AM to update them.  Assessment: Single IUP at [redacted]w[redacted]d Gestatonal Hypertension Headache, resolved  Plan: Discharge home Preeclampsia precautions Advised to call office in AM to arrange followup   Has appt Thursday Preterm Labor precautions and fetal kick counts Follow up in Office for prenatal visits and recheck of BP  Encouraged to return here or to other Urgent Care/ED if she develops worsening of symptoms, increase in pain, fever, or other concerning symptoms.  Pt stable at time of discharge.  Hansel Feinstein CNM, MSN Certified Nurse-Midwife 06/08/2019 9:01 PM

## 2019-06-08 NOTE — Discharge Instructions (Signed)

## 2019-06-10 DIAGNOSIS — O358XX9 Maternal care for other (suspected) fetal abnormality and damage, other fetus: Secondary | ICD-10-CM | POA: Diagnosis not present

## 2019-06-10 DIAGNOSIS — Z3A34 34 weeks gestation of pregnancy: Secondary | ICD-10-CM | POA: Diagnosis not present

## 2019-06-14 DIAGNOSIS — Z3A35 35 weeks gestation of pregnancy: Secondary | ICD-10-CM | POA: Diagnosis not present

## 2019-06-14 DIAGNOSIS — Z3685 Encounter for antenatal screening for Streptococcus B: Secondary | ICD-10-CM | POA: Diagnosis not present

## 2019-06-14 DIAGNOSIS — O99891 Other specified diseases and conditions complicating pregnancy: Secondary | ICD-10-CM | POA: Diagnosis not present

## 2019-06-17 ENCOUNTER — Telehealth (HOSPITAL_COMMUNITY): Payer: Self-pay | Admitting: *Deleted

## 2019-06-17 ENCOUNTER — Encounter (HOSPITAL_COMMUNITY): Payer: Self-pay | Admitting: *Deleted

## 2019-06-17 DIAGNOSIS — O139 Gestational [pregnancy-induced] hypertension without significant proteinuria, unspecified trimester: Secondary | ICD-10-CM | POA: Diagnosis not present

## 2019-06-17 DIAGNOSIS — Z3A35 35 weeks gestation of pregnancy: Secondary | ICD-10-CM | POA: Diagnosis not present

## 2019-06-17 NOTE — Telephone Encounter (Signed)
Preadmission screen  

## 2019-06-18 LAB — OB RESULTS CONSOLE GBS: GBS: NEGATIVE

## 2019-06-21 ENCOUNTER — Encounter (HOSPITAL_COMMUNITY): Payer: Self-pay | Admitting: *Deleted

## 2019-06-21 ENCOUNTER — Telehealth (HOSPITAL_COMMUNITY): Payer: Self-pay | Admitting: *Deleted

## 2019-06-21 DIAGNOSIS — O139 Gestational [pregnancy-induced] hypertension without significant proteinuria, unspecified trimester: Secondary | ICD-10-CM | POA: Diagnosis not present

## 2019-06-21 DIAGNOSIS — Z3A36 36 weeks gestation of pregnancy: Secondary | ICD-10-CM | POA: Diagnosis not present

## 2019-06-21 NOTE — Telephone Encounter (Signed)
Preadmission screen  

## 2019-06-24 DIAGNOSIS — O99891 Other specified diseases and conditions complicating pregnancy: Secondary | ICD-10-CM | POA: Diagnosis not present

## 2019-06-26 ENCOUNTER — Other Ambulatory Visit (HOSPITAL_COMMUNITY)
Admission: RE | Admit: 2019-06-26 | Discharge: 2019-06-26 | Disposition: A | Discharge status: Home / Self Care | Payer: BC Managed Care – PPO | Source: Ambulatory Visit | Attending: Obstetrics and Gynecology | Admitting: Obstetrics and Gynecology

## 2019-06-26 DIAGNOSIS — Z01812 Encounter for preprocedural laboratory examination: Secondary | ICD-10-CM | POA: Insufficient documentation

## 2019-06-26 DIAGNOSIS — Z20822 Contact with and (suspected) exposure to covid-19: Secondary | ICD-10-CM | POA: Diagnosis not present

## 2019-06-26 LAB — SARS CORONAVIRUS 2 (TAT 6-24 HRS): SARS Coronavirus 2: NEGATIVE

## 2019-06-28 ENCOUNTER — Other Ambulatory Visit: Payer: Self-pay

## 2019-06-28 ENCOUNTER — Inpatient Hospital Stay (HOSPITAL_COMMUNITY): Payer: BC Managed Care – PPO

## 2019-06-28 ENCOUNTER — Encounter (HOSPITAL_COMMUNITY): Payer: Self-pay | Admitting: Obstetrics and Gynecology

## 2019-06-28 ENCOUNTER — Inpatient Hospital Stay (HOSPITAL_COMMUNITY): Payer: BC Managed Care – PPO | Admitting: Anesthesiology

## 2019-06-28 ENCOUNTER — Inpatient Hospital Stay (HOSPITAL_COMMUNITY)
Admission: AD | Admit: 2019-06-28 | Discharge: 2019-07-01 | DRG: 806 | Disposition: A | Discharge status: Home / Self Care | Payer: BC Managed Care – PPO | Attending: Obstetrics and Gynecology | Admitting: Obstetrics and Gynecology

## 2019-06-28 DIAGNOSIS — O99214 Obesity complicating childbirth: Secondary | ICD-10-CM | POA: Diagnosis not present

## 2019-06-28 DIAGNOSIS — O864 Pyrexia of unknown origin following delivery: Secondary | ICD-10-CM | POA: Diagnosis not present

## 2019-06-28 DIAGNOSIS — O10919 Unspecified pre-existing hypertension complicating pregnancy, unspecified trimester: Secondary | ICD-10-CM | POA: Diagnosis present

## 2019-06-28 DIAGNOSIS — O1002 Pre-existing essential hypertension complicating childbirth: Principal | ICD-10-CM | POA: Diagnosis present

## 2019-06-28 DIAGNOSIS — Z3A37 37 weeks gestation of pregnancy: Secondary | ICD-10-CM | POA: Diagnosis not present

## 2019-06-28 DIAGNOSIS — O164 Unspecified maternal hypertension, complicating childbirth: Secondary | ICD-10-CM | POA: Diagnosis not present

## 2019-06-28 HISTORY — DX: Unspecified pre-existing hypertension complicating pregnancy, unspecified trimester: O10.919

## 2019-06-28 LAB — CBC
HCT: 36.3 % (ref 36.0–46.0)
HCT: 36.7 % (ref 36.0–46.0)
Hemoglobin: 12 g/dL (ref 12.0–15.0)
Hemoglobin: 11.9 g/dL — ABNORMAL LOW (ref 12.0–15.0)
MCH: 28.8 pg (ref 26.0–34.0)
MCH: 27.9 pg (ref 26.0–34.0)
MCHC: 33.1 g/dL (ref 30.0–36.0)
MCHC: 32.4 g/dL (ref 30.0–36.0)
MCV: 87.1 fL (ref 80.0–100.0)
MCV: 86.2 fL (ref 80.0–100.0)
Platelets: 180 10*3/uL (ref 150–400)
Platelets: 172 10*3/uL (ref 150–400)
RBC: 4.17 MIL/uL (ref 3.87–5.11)
RBC: 4.26 MIL/uL (ref 3.87–5.11)
RDW: 14 % (ref 11.5–15.5)
RDW: 13.9 % (ref 11.5–15.5)
WBC: 8.2 10*3/uL (ref 4.0–10.5)
WBC: 9.7 10*3/uL (ref 4.0–10.5)
nRBC: 0 % (ref 0.0–0.2)
nRBC: 0 % (ref 0.0–0.2)

## 2019-06-28 LAB — RPR: RPR Ser Ql: NONREACTIVE

## 2019-06-28 LAB — TYPE AND SCREEN
ABO/RH(D): O POS
Antibody Screen: NEGATIVE

## 2019-06-28 LAB — ABO/RH: ABO/RH(D): O POS

## 2019-06-28 MED ORDER — LACTATED RINGERS IV SOLN
500.0000 mL | Freq: Once | INTRAVENOUS | Status: AC
  Administered 2019-06-28: 500 mL via INTRAVENOUS

## 2019-06-28 MED ORDER — BUTORPHANOL TARTRATE 1 MG/ML IJ SOLN
1.0000 mg | Freq: Once | INTRAMUSCULAR | Status: AC
  Administered 2019-06-28: 1 mg via INTRAVENOUS
  Filled 2019-06-28: qty 1

## 2019-06-28 MED ORDER — OXYCODONE-ACETAMINOPHEN 5-325 MG PO TABS
2.0000 | ORAL_TABLET | ORAL | Status: DC | PRN
  Administered 2019-06-29: 2 via ORAL
  Filled 2019-06-28: qty 2

## 2019-06-28 MED ORDER — FENTANYL-BUPIVACAINE-NACL 0.5-0.125-0.9 MG/250ML-% EP SOLN
12.0000 mL/h | EPIDURAL | Status: DC | PRN
  Filled 2019-06-28 (×2): qty 250

## 2019-06-28 MED ORDER — DIPHENHYDRAMINE HCL 50 MG/ML IJ SOLN: 12.5000 mg | INTRAMUSCULAR | Status: DC | PRN

## 2019-06-28 MED ORDER — ACETAMINOPHEN 325 MG PO TABS
650.0000 mg | ORAL_TABLET | ORAL | Status: DC | PRN
  Administered 2019-06-29: 650 mg via ORAL
  Filled 2019-06-28 (×2): qty 2

## 2019-06-28 MED ORDER — EPHEDRINE 5 MG/ML INJ: 10.0000 mg | INTRAVENOUS | Status: DC | PRN

## 2019-06-28 MED ORDER — SOD CITRATE-CITRIC ACID 500-334 MG/5ML PO SOLN
30.0000 mL | ORAL | Status: DC | PRN
  Administered 2019-06-28: 30 mL via ORAL
  Filled 2019-06-28: qty 30

## 2019-06-28 MED ORDER — OXYTOCIN BOLUS FROM INFUSION
500.0000 mL | Freq: Once | INTRAVENOUS | Status: AC
  Administered 2019-06-29: 500 mL via INTRAVENOUS

## 2019-06-28 MED ORDER — TERBUTALINE SULFATE 1 MG/ML IJ SOLN: 0.2500 mg | Freq: Once | INTRAMUSCULAR | Status: DC | PRN

## 2019-06-28 MED ORDER — ONDANSETRON HCL 4 MG/2ML IJ SOLN
4.0000 mg | Freq: Four times a day (QID) | INTRAMUSCULAR | Status: DC | PRN
  Administered 2019-06-28: 4 mg via INTRAVENOUS
  Filled 2019-06-28 (×2): qty 2

## 2019-06-28 MED ORDER — LACTATED RINGERS IV SOLN: INTRAVENOUS | Status: DC

## 2019-06-28 MED ORDER — LIDOCAINE HCL (PF) 1 % IJ SOLN
INTRAMUSCULAR | Status: DC | PRN
  Administered 2019-06-28: 10 mL via EPIDURAL

## 2019-06-28 MED ORDER — LIDOCAINE HCL (PF) 1 % IJ SOLN: 30.0000 mL | INTRAMUSCULAR | Status: DC | PRN

## 2019-06-28 MED ORDER — SODIUM CHLORIDE (PF) 0.9 % IJ SOLN
INTRAMUSCULAR | Status: DC | PRN
  Administered 2019-06-28: 12 mL/h via EPIDURAL

## 2019-06-28 MED ORDER — OXYTOCIN 40 UNITS IN NORMAL SALINE INFUSION - SIMPLE MED: 2.5000 [IU]/h | INTRAVENOUS | Status: DC

## 2019-06-28 MED ORDER — LACTATED RINGERS IV SOLN: 500.0000 mL | INTRAVENOUS | Status: DC | PRN

## 2019-06-28 MED ORDER — OXYCODONE-ACETAMINOPHEN 5-325 MG PO TABS: 1.0000 | ORAL_TABLET | ORAL | Status: DC | PRN

## 2019-06-28 MED ORDER — OXYTOCIN 40 UNITS IN NORMAL SALINE INFUSION - SIMPLE MED
1.0000 m[IU]/min | INTRAVENOUS | Status: DC
  Administered 2019-06-28: 4 m[IU]/min via INTRAVENOUS
  Filled 2019-06-28 (×2): qty 1000

## 2019-06-28 MED ORDER — PHENYLEPHRINE 40 MCG/ML (10ML) SYRINGE FOR IV PUSH (FOR BLOOD PRESSURE SUPPORT): 80.0000 ug | PREFILLED_SYRINGE | INTRAVENOUS | Status: DC | PRN

## 2019-06-28 MED ORDER — MISOPROSTOL 25 MCG QUARTER TABLET
25.0000 ug | ORAL_TABLET | ORAL | Status: DC | PRN
  Administered 2019-06-28 (×2): 25 ug via VAGINAL
  Filled 2019-06-28 (×2): qty 1

## 2019-06-28 NOTE — Anesthesia Procedure Notes (Signed)
Epidural Patient location during procedure: OB Start time: 06/28/2019 12:15 PM End time: 06/28/2019 12:26 PM  Staffing Anesthesiologist: Lucretia Kern, MD Performed: anesthesiologist   Preanesthetic Checklist Completed: patient identified, IV checked, risks and benefits discussed, monitors and equipment checked, pre-op evaluation and timeout performed  Epidural Patient position: sitting Prep: DuraPrep Patient monitoring: heart rate, continuous pulse ox and blood pressure Approach: midline Location: L3-L4 Injection technique: LOR air  Needle:  Needle type: Tuohy  Needle gauge: 17 G Needle length: 9 cm Needle insertion depth: 6 cm Catheter type: closed end flexible Catheter size: 19 Gauge Catheter at skin depth: 11 cm Test dose: negative  Assessment Events: blood not aspirated, injection not painful, no injection resistance, no paresthesia and negative IV test  Additional Notes Reason for block:procedure for pain

## 2019-06-28 NOTE — H&P (Signed)
Julia [name] is a 34 y.o. G 1 P 0 at 37 w 2 days presents for IOL secondary to Chronic Hypertension  She is status post Cytotec. OB History    Gravida  1   Para  0   Term  0   Preterm  0   AB  0   Living  0     SAB  0   TAB  0   Ectopic  0   Multiple  0   Live Births             Past Medical History:  Diagnosis Date  . Acne   . Chronic hypertension affecting pregnancy 06/28/2019  . Pregnancy induced hypertension   . Vaginal Pap smear, abnormal    Past Surgical History:  Procedure Laterality Date  . CHOLECYSTECTOMY  12/2008  . DRUG INDUCED ENDOSCOPY    . gallstone removal    . TONSILLECTOMY     Family History: family history includes AAA (abdominal aortic aneurysm) in her paternal aunt and paternal grandmother; Alcohol abuse in her father and maternal grandmother; Asthma in her mother; Hypertension in her mother. Social History:  reports that she has never smoked. She has never used smokeless tobacco. She reports previous alcohol use. She reports that she does not use drugs.     Maternal Diabetes: No Genetic Screening: Normal Maternal Ultrasounds/Referrals: Normal Fetal Ultrasounds or other Referrals:  None Maternal Substance Abuse:  No Significant Maternal Medications:  None Significant Maternal Lab Results:  None Other Comments:  None  Review of Systems  All other systems reviewed and are negative.  Maternal Medical History:  Prenatal complications: PIH.     Dilation: 1 Effacement (%): 50 Station: -2 Exam by:: Dr. Vincente Poli Blood pressure (!) 159/96, pulse 92, temperature 98.1 F (36.7 C), temperature source Oral, resp. rate 16, height 5\' 3"  (1.6 m), weight 134.7 kg, last menstrual period 10/10/2018. Maternal Exam:  Uterine Assessment: Contraction strength is moderate.  Contraction frequency is regular.   Abdomen: Fetal presentation: vertex     Fetal Exam Fetal State Assessment: Category I - tracings are normal.     Physical Exam   Nursing note and vitals reviewed. Constitutional: She appears well-developed and well-nourished.  HENT:  Head: Normocephalic.  Eyes: Pupils are equal, round, and reactive to light.  Cardiovascular: Normal rate and regular rhythm.  Respiratory: Effort normal.  Musculoskeletal:     Cervical back: Normal range of motion.    Prenatal labs: ABO, Rh: --/--/O POS, O POS Performed at [facility], [address] [place], [place]  [phone]) Antibody: NEG (03/15 0032) Rubella: Nonimmune (08/24 0000) RPR: Nonreactive (08/24 0000)  HBsAg: Negative (08/24 0000)  HIV: Non-reactive (08/24 0000)  GBS: Negative/-- (03/05 0000)   Assessment/Plan: IUP at term  Chronic Hypertension IOL Pitocin per protocol  Epidural prn Follow labor curve   07-28-1969 06/28/2019, 7:56 AM

## 2019-06-28 NOTE — Anesthesia Preprocedure Evaluation (Signed)
Anesthesia Evaluation  Patient identified by MRN, date of birth, ID band Patient awake    Reviewed: Allergy & Precautions, H&P , NPO status , Patient's Chart, lab work & pertinent test results  History of Anesthesia Complications Negative for: history of anesthetic complications  Airway Mallampati: II  TM Distance: >3 FB Neck ROM: full    Dental no notable dental hx.    Pulmonary neg pulmonary ROS,    Pulmonary exam normal        Cardiovascular hypertension, Normal cardiovascular exam Rhythm:regular Rate:Normal     Neuro/Psych negative neurological ROS  negative psych ROS   GI/Hepatic negative GI ROS, Neg liver ROS,   Endo/Other  Morbid obesity  Renal/GU      Musculoskeletal   Abdominal   Peds  Hematology negative hematology ROS (+)   Anesthesia Other Findings   Reproductive/Obstetrics (+) Pregnancy                             Anesthesia Physical Anesthesia Plan  ASA: III  Anesthesia Plan: Epidural   Post-op Pain Management:    Induction:   PONV Risk Score and Plan:   Airway Management Planned:   Additional Equipment:   Intra-op Plan:   Post-operative Plan:   Informed Consent: I have reviewed the patients History and Physical, chart, labs and discussed the procedure including the risks, benefits and alternatives for the proposed anesthesia with the patient or authorized representative who has indicated his/her understanding and acceptance.       Plan Discussed with:   Anesthesia Plan Comments:         Anesthesia Quick Evaluation  

## 2019-06-28 NOTE — Progress Notes (Signed)
Came to hospital to evaluate the patient She is currently comfortable  BP 116/63   Pulse 97   Temp 98 F (36.7 C) (Oral)   Resp 16   Ht 5\' 3"  (1.6 m)   Wt 134.7 kg   LMP 10/10/2018   BMI 52.59 kg/m  FHR no decelerations but some limited variability  IUPC contractions every 2 minutes Pitocin at 40 mu  Cervix is 3 cm significant caput and swollen all the way around and -2  OP position   Had an extensive discussion with the patient about slow cervical progress and dosage of Pitocin and the potential risks to her and the baby   Discussed C Section   Patient declines - wants to wait and be checked later on even though she is aware of risks of fetal distress, maternal hemorrhage and need for emergency C Section  She agrees to another exam at midnight.  She agrees to C Section if fetal distress or maternal temperature occurs.  Plan of care reviewed with nursing staff.

## 2019-06-28 NOTE — Progress Notes (Signed)
Patient now status post epidural and feeling better.  BP 130/83   Pulse 90   Temp 97.9 F (36.6 C) (Oral)   Resp 14   Ht 5\' 3"  (1.6 m)   Wt 134.7 kg   LMP 10/10/2018   BMI 52.59 kg/m   FHR Category 1  Cervix   Cervix is tight /loose 1 cm/ -2 IUPC placed ?OP position  Discussion with patient and her spouse Will measure montevideo units and titrate pitocin Follow labor curve closely

## 2019-06-29 ENCOUNTER — Encounter (HOSPITAL_COMMUNITY)
Admission: AD | Disposition: A | Discharge status: Home / Self Care | Payer: Self-pay | Source: Home / Self Care | Attending: Obstetrics and Gynecology

## 2019-06-29 ENCOUNTER — Encounter (HOSPITAL_COMMUNITY): Payer: Self-pay | Admitting: Obstetrics and Gynecology

## 2019-06-29 DIAGNOSIS — O1002 Pre-existing essential hypertension complicating childbirth: Secondary | ICD-10-CM | POA: Diagnosis not present

## 2019-06-29 LAB — RUBELLA SCREEN: Rubella: <0.9 index — ABNORMAL LOW (ref >0.99)

## 2019-06-29 SURGERY — Surgical Case
Anesthesia: Epidural

## 2019-06-29 MED ORDER — MEDROXYPROGESTERONE ACETATE 150 MG/ML IM SUSP: 150.0000 mg | INTRAMUSCULAR | Status: DC | PRN

## 2019-06-29 MED ORDER — IBUPROFEN 800 MG PO TABS
800.0000 mg | ORAL_TABLET | Freq: Once | ORAL | Status: AC
  Administered 2019-06-29: 800 mg via ORAL
  Filled 2019-06-29: qty 1

## 2019-06-29 MED ORDER — SENNOSIDES-DOCUSATE SODIUM 8.6-50 MG PO TABS
2.0000 | ORAL_TABLET | ORAL | Status: DC
  Administered 2019-06-29 – 2019-07-01 (×2): 2 via ORAL
  Filled 2019-06-29 (×2): qty 2

## 2019-06-29 MED ORDER — ZOLPIDEM TARTRATE 5 MG PO TABS: 5.0000 mg | ORAL_TABLET | Freq: Every evening | ORAL | Status: DC | PRN

## 2019-06-29 MED ORDER — FENTANYL CITRATE (PF) 100 MCG/2ML IJ SOLN
INTRAMUSCULAR | Status: AC
  Filled 2019-06-29: qty 2

## 2019-06-29 MED ORDER — MORPHINE SULFATE (PF) 0.5 MG/ML IJ SOLN
INTRAMUSCULAR | Status: AC
  Filled 2019-06-29: qty 10

## 2019-06-29 MED ORDER — DEXAMETHASONE SODIUM PHOSPHATE 4 MG/ML IJ SOLN
INTRAMUSCULAR | Status: AC
  Filled 2019-06-29: qty 1

## 2019-06-29 MED ORDER — COCONUT OIL OIL: 1.0000 "application " | TOPICAL_OIL | Status: DC | PRN

## 2019-06-29 MED ORDER — SIMETHICONE 80 MG PO CHEW: 80.0000 mg | CHEWABLE_TABLET | ORAL | Status: DC | PRN

## 2019-06-29 MED ORDER — LIDOCAINE-EPINEPHRINE (PF) 2 %-1:200000 IJ SOLN
INTRAMUSCULAR | Status: AC
  Filled 2019-06-29: qty 10

## 2019-06-29 MED ORDER — ACETAMINOPHEN 325 MG PO TABS: 650.0000 mg | ORAL_TABLET | ORAL | Status: DC | PRN

## 2019-06-29 MED ORDER — DIPHENHYDRAMINE HCL 25 MG PO CAPS: 25.0000 mg | ORAL_CAPSULE | Freq: Four times a day (QID) | ORAL | Status: DC | PRN

## 2019-06-29 MED ORDER — SODIUM CHLORIDE 0.9 % IV SOLN
3.0000 g | Freq: Once | INTRAVENOUS | Status: AC
  Administered 2019-06-29: 3 g via INTRAVENOUS
  Filled 2019-06-29: qty 8

## 2019-06-29 MED ORDER — PRENATAL MULTIVITAMIN CH
1.0000 | ORAL_TABLET | Freq: Every day | ORAL | Status: DC
  Administered 2019-06-29 – 2019-07-01 (×3): 1 via ORAL
  Filled 2019-06-29 (×3): qty 1

## 2019-06-29 MED ORDER — ONDANSETRON HCL 4 MG/2ML IJ SOLN: 4.0000 mg | INTRAMUSCULAR | Status: DC | PRN

## 2019-06-29 MED ORDER — SODIUM CHLORIDE 0.9 % IV SOLN
3.0000 g | Freq: Four times a day (QID) | INTRAVENOUS | Status: AC
  Administered 2019-06-29 – 2019-06-30 (×4): 3 g via INTRAVENOUS
  Filled 2019-06-29 (×3): qty 3
  Filled 2019-06-29: qty 8

## 2019-06-29 MED ORDER — ONDANSETRON HCL 4 MG PO TABS: 4.0000 mg | ORAL_TABLET | ORAL | Status: DC | PRN

## 2019-06-29 MED ORDER — BUPIVACAINE HCL (PF) 0.25 % IJ SOLN
INTRAMUSCULAR | Status: DC | PRN
  Administered 2019-06-29 (×2): 5 mL via EPIDURAL

## 2019-06-29 MED ORDER — OXYTOCIN 40 UNITS IN NORMAL SALINE INFUSION - SIMPLE MED
INTRAVENOUS | Status: AC
  Filled 2019-06-29: qty 1000

## 2019-06-29 MED ORDER — TETANUS-DIPHTH-ACELL PERTUSSIS 5-2.5-18.5 LF-MCG/0.5 IM SUSP: 0.5000 mL | Freq: Once | INTRAMUSCULAR | Status: DC

## 2019-06-29 MED ORDER — SODIUM CHLORIDE 0.9 % IV SOLN: 3.0000 g | Freq: Four times a day (QID) | INTRAVENOUS | Status: DC

## 2019-06-29 MED ORDER — ONDANSETRON HCL 4 MG/2ML IJ SOLN
INTRAMUSCULAR | Status: AC
  Filled 2019-06-29: qty 2

## 2019-06-29 MED ORDER — IBUPROFEN 600 MG PO TABS
600.0000 mg | ORAL_TABLET | Freq: Four times a day (QID) | ORAL | Status: DC
  Administered 2019-06-29 – 2019-07-01 (×9): 600 mg via ORAL
  Filled 2019-06-29 (×10): qty 1

## 2019-06-29 MED ORDER — SODIUM CHLORIDE 0.9 % IV SOLN
3.0000 g | Freq: Once | INTRAVENOUS | Status: DC
  Filled 2019-06-29: qty 8

## 2019-06-29 MED ORDER — DIBUCAINE (PERIANAL) 1 % EX OINT: 1.0000 "application " | TOPICAL_OINTMENT | CUTANEOUS | Status: DC | PRN

## 2019-06-29 MED ORDER — BENZOCAINE-MENTHOL 20-0.5 % EX AERO
1.0000 "application " | INHALATION_SPRAY | CUTANEOUS | Status: DC | PRN
  Administered 2019-06-29: 1 via TOPICAL
  Filled 2019-06-29: qty 56

## 2019-06-29 MED ORDER — SCOPOLAMINE 1 MG/3DAYS TD PT72
MEDICATED_PATCH | TRANSDERMAL | Status: AC
  Filled 2019-06-29: qty 1

## 2019-06-29 MED ORDER — MEASLES, MUMPS & RUBELLA VAC IJ SOLR
0.5000 mL | Freq: Once | INTRAMUSCULAR | Status: AC
  Administered 2019-07-01: 0.5 mL via SUBCUTANEOUS
  Filled 2019-06-29: qty 0.5

## 2019-06-29 MED ORDER — WITCH HAZEL-GLYCERIN EX PADS: 1.0000 "application " | MEDICATED_PAD | CUTANEOUS | Status: DC | PRN

## 2019-06-29 MED ORDER — METOCLOPRAMIDE HCL 5 MG/ML IJ SOLN
INTRAMUSCULAR | Status: AC
  Filled 2019-06-29: qty 2

## 2019-06-29 NOTE — Progress Notes (Signed)
Patient has been pushing for a little over an hour Progress is very minimal. Baby does have significant caput Fetal tachycardia to 180s Will discuss with Dr. Rana Snare who is coming on possible trial of vacuum and if unsuccessful would recommend C Section Patient now seems more agreeable to plan

## 2019-06-29 NOTE — Progress Notes (Signed)
Patient has had slow progress overnight.  Became complete around 515 and started pushing at 615  Developed fever to 101 with some fetal tachycardia and given Tylenol and Unasyn  Exam C / C / +2  Pushing well  Will continue to monitor closely

## 2019-06-29 NOTE — Anesthesia Postprocedure Evaluation (Signed)
Anesthesia Post Note  Patient: Julia [name]  Procedure(s) Performed: AN AD HOC LABOR EPIDURAL     Patient location during evaluation: Mother Baby Anesthesia Type: Epidural Level of consciousness: awake and alert and oriented Pain management: satisfactory to patient Vital Signs Assessment: post-procedure vital signs reviewed and stable Respiratory status: respiratory function stable Cardiovascular status: stable Postop Assessment: no headache, no backache, epidural receding, patient able to bend at knees, no signs of nausea or vomiting and adequate PO intake Anesthetic complications: no    Last Vitals:  Vitals:   06/29/19 1010 06/29/19 1110  BP: 117/71 117/64  Pulse: (!) 116 (!) 108  Resp: 20 18  Temp: 37.2 C 36.7 C    Last Pain:  Vitals:   06/29/19 1110  TempSrc: Oral  PainSc: 1    Pain Goal: Patients Stated Pain Goal: 2 (06/29/19 1110)                 Michel Eskelson

## 2019-06-30 LAB — CBC
HCT: 31 % — ABNORMAL LOW (ref 36.0–46.0)
HCT: 29.3 % — ABNORMAL LOW (ref 36.0–46.0)
Hemoglobin: 10.1 g/dL — ABNORMAL LOW (ref 12.0–15.0)
Hemoglobin: 9.5 g/dL — ABNORMAL LOW (ref 12.0–15.0)
MCH: 28.5 pg (ref 26.0–34.0)
MCH: 28.2 pg (ref 26.0–34.0)
MCHC: 32.6 g/dL (ref 30.0–36.0)
MCHC: 32.4 g/dL (ref 30.0–36.0)
MCV: 87.6 fL (ref 80.0–100.0)
MCV: 86.9 fL (ref 80.0–100.0)
Platelets: 141 10*3/uL — ABNORMAL LOW (ref 150–400)
Platelets: 142 10*3/uL — ABNORMAL LOW (ref 150–400)
RBC: 3.54 MIL/uL — ABNORMAL LOW (ref 3.87–5.11)
RBC: 3.37 MIL/uL — ABNORMAL LOW (ref 3.87–5.11)
RDW: 14.2 % (ref 11.5–15.5)
RDW: 14.3 % (ref 11.5–15.5)
WBC: 14.2 10*3/uL — ABNORMAL HIGH (ref 4.0–10.5)
WBC: 11.1 10*3/uL — ABNORMAL HIGH (ref 4.0–10.5)
nRBC: 0 % (ref 0.0–0.2)
nRBC: 0 % (ref 0.0–0.2)

## 2019-06-30 LAB — COMPREHENSIVE METABOLIC PANEL
ALT: 12 U/L (ref 0–44)
AST: 16 U/L (ref 15–41)
Albumin: 2 g/dL — ABNORMAL LOW (ref 3.5–5.0)
Alkaline Phosphatase: 74 U/L (ref 38–126)
Anion gap: 9 (ref 5–15)
BUN: 8 mg/dL (ref 6–20)
CO2: 21 mmol/L — ABNORMAL LOW (ref 22–32)
Calcium: 8.9 mg/dL (ref 8.9–10.3)
Chloride: 108 mmol/L (ref 98–111)
Creatinine, Ser: 0.86 mg/dL (ref 0.44–1.00)
GFR calc Af Amer: >60 mL/min (ref >60)
GFR calc non Af Amer: >60 mL/min (ref >60)
Glucose, Bld: 137 mg/dL — ABNORMAL HIGH (ref 70–99)
Potassium: 3.7 mmol/L (ref 3.5–5.1)
Sodium: 138 mmol/L (ref 135–145)
Total Bilirubin: 0.3 mg/dL (ref 0.3–1.2)
Total Protein: 5 g/dL — ABNORMAL LOW (ref 6.5–8.1)

## 2019-06-30 NOTE — Lactation Note (Signed)
This note was copied from a baby's chart. Lactation Consultation Note  Patient Name: Julia [name] POEUM'P Date: 06/30/2019 Reason for consult: Initial assessment;Primapara;1st time breastfeeding;Early term 37-38.6wks;Other (Comment)(mom aware to call with feeding cues) Baby is 26 hours old P 1  Baby has had the breast offered and latched 2 times short intervals.  Mom has pumped and baby has received total of 18 ml with spoon or curved tip syringe.  Mom voiced concerns baby isn't getting enough.  LC reassured mom for the baby being only 26 hours has received EBM and latched x 2 . In the next 24 hours will expect more latches. Voids and stools are QS for age.  Mom has pumped with the DEBP set up by the [facility] and per mom was surprised she wasn't able to pump off more. LC reassured mom its a slow process.  Mom has a lot of generalized edema of the feet and ankles.  Per mom has been leaking 32 weeks on and more from the right than the left.  LC reassured mom that is normal and a good sign she had breast changes and leaking.  LC reviewed hand expressing and provided a colostrum container and encouraged mom to continue hand expressing prior to latching and in between.  LC noted compressible areolas with edema. LC showed mom how to do the reverse pressure exercise and instructed her on the use shells between feedings except when sleeping.  LC provided mom with LPT information due to her baby being an ET infant and the [facility] pamphlet with phone numbers after D/C .   Maternal Data Has patient been taught Hand Expression?: Yes  Feeding Feeding Type: (per mom baby last fed at 830 for 10 mins total)  LATCH Score ( Latch Score by the RN student )  Latch: Repeated attempts needed to sustain latch, nipple held in mouth throughout feeding, stimulation needed to elicit sucking reflex.  Audible Swallowing: None  Type of Nipple: Everted at rest and after stimulation(short shafts)  Comfort  (Breast/Nipple): Soft / non-tender  Hold (Positioning): Assistance needed to correctly position infant at breast and maintain latch.  LATCH Score: 6  Interventions Interventions: Breast feeding basics reviewed  Lactation Tools Discussed/Used Tools: Shells;Pump Shell Type: Inverted Breast pump type: Double-Electric Breast Pump   Consult Status Consult Status: Follow-up Date: 06/30/19 Follow-up type: In-patient    Julia [name] 06/30/2019, 10:09 AM

## 2019-06-30 NOTE — Progress Notes (Signed)
Patient complaining of her feet really hurting due to 1+ pedal edema in both extremities. Dr. Henderson Cloud aware , and he is also aware of the blood pressure of 136/98. Waiting for orders. Otherwise patient has no other complaints and feels fine.

## 2019-06-30 NOTE — Progress Notes (Signed)
Post Partum Day 1 Subjective: no complaints, up ad lib, voiding, tolerating PO and + flatus Working to establish breast feeding Objective: Blood pressure 128/75, pulse (!) 102, temperature 98 F (36.7 C), temperature source Oral, resp. rate 20, height 5\' 3"  (1.6 m), weight 134.7 kg, last menstrual period 10/10/2018, SpO2 99 %, unknown if currently breastfeeding.  Physical Exam:  General: alert, cooperative and no distress Lochia: appropriate Uterine Fundus: firm Incision: healing well DVT Evaluation: No evidence of DVT seen on physical exam.  Recent Labs    06/28/19 0032 06/28/19 0930  HGB 12.0 11.9*  HCT 36.3 36.7    Assessment/Plan: Plan for discharge tomorrow   LOS: 2 days   06/30/19 II 06/30/2019, 7:13 AM

## 2019-07-01 ENCOUNTER — Ambulatory Visit: Payer: Self-pay

## 2019-07-01 MED ORDER — IBUPROFEN 600 MG PO TABS: 600.0000 mg | ORAL_TABLET | Freq: Four times a day (QID) | ORAL | 0 refills | Status: DC

## 2019-07-01 NOTE — Discharge Summary (Signed)
Obstetric Discharge Summary Reason for Admission: induction of labor and CHTN Prenatal Procedures: none Intrapartum Procedures: spontaneous vaginal delivery and vacuum Postpartum Procedures: none Complications-Operative and Postpartum: 2nd degree perineal laceration Hemoglobin  Date Value Ref Range Status  06/30/2019 9.5 (L) 12.0 - 15.0 g/dL Final   HCT  Date Value Ref Range Status  06/30/2019 29.3 (L) 36.0 - 46.0 % Final    Physical Exam:  General: alert, cooperative and appears stated age 11: appropriate Uterine Fundus: firm Incision: healing well, no significant drainage, no dehiscence DVT Evaluation: No evidence of DVT seen on physical exam. Negative Homan's sign. No cords or calf tenderness.  Discharge Diagnoses: Term Pregnancy-delivered and CHTN  Discharge Information: Date: 07/01/2019 Activity: pelvic rest Diet: routine Medications: PNV and Ibuprofen Condition: stable Instructions: refer to practice specific booklet Discharge to: home   Newborn Data: Live born female  Birth Weight: 6 lb 12.1 oz (3065 g) APGAR: 9, 9  Newborn Delivery   Birth date/time: 06/29/2019 08:00:00 Delivery type: Vaginal, Vacuum (Extractor)      Home with mother.  Mitchel Honour 07/01/2019, 8:48 AM

## 2019-07-01 NOTE — Discharge Instructions (Signed)
Call MD for T>100.4, heavy vaginal bleeding, severe abdominal pain, intractable nausea and/or vomiting, headache, or respiratory distress.  Call office to schedule postpartum visit in 6 weeks.  Pelvic rest x 6 weeks.

## 2019-07-01 NOTE — Lactation Note (Signed)
This note was copied from a baby's chart. Lactation Consultation Note  Patient Name: [name] Today's Date: 07/01/2019  P1, 62 hour, ETI infant, weight loss -7%. Infant had 3 voids and 2 stools today. On phototherapy, per mom infant is now breastfeeding 20 minutes most feedings and latching well. Mom  is supplementing with her EBM from pumping, she is now getting 11 to 13 mls each pumping session and is offering it to infant after latching infant at breast.  Mom has no questions or concerns at this time. LC did not observe latch, infant in basinet on billi lights.    Maternal Data    Feeding    LATCH Score                   Interventions    Lactation Tools Discussed/Used     Consult Status      Julia [name] 07/01/2019, 10:17 PM

## 2019-07-01 NOTE — Lactation Note (Signed)
This note was copied from a baby's chart. Lactation Consultation Note  Patient Name: Julia [name] AUEBV'P Date: 07/01/2019 Reason for consult: Follow-up assessment;Hyperbilirubinemia;Early term 37-38.6wks Baby is 49 hours old/7% weight loss.  Baby remains on phototherapy.  Mom is feeling good about feedings.  Baby feeds for 15 minutes and then supplements with expressed breast milk with curved tip syringe.  Mom states baby does well with syringe.  Observed mom latch baby to right breast in football hold.  It took several attempts but then baby latched well.  Observed feeding for 10 minutes and baby still feeding when I left.  Mom using good breast massage.  Mom will continue to post pump and syringe feed baby.  Reviewed outpatient services and encouraged to call prn.  Maternal Data    Feeding Feeding Type: Breast Fed  LATCH Score Latch: Grasps breast easily, tongue down, lips flanged, rhythmical sucking.  Audible Swallowing: A few with stimulation  Type of Nipple: Everted at rest and after stimulation  Comfort (Breast/Nipple): Soft / non-tender  Hold (Positioning): No assistance needed to correctly position infant at breast.  LATCH Score: 9  Interventions    Lactation Tools Discussed/Used     Consult Status Consult Status: Follow-up Date: 07/02/19 Follow-up type: In-patient    Huston Foley 07/01/2019, 9:53 AM

## 2019-07-02 ENCOUNTER — Ambulatory Visit: Payer: Self-pay

## 2019-07-02 NOTE — Lactation Note (Signed)
This note was copied from a baby's chart. Lactation Consultation Note  Patient Name: Julia [name] [id number] Date: 07/02/2019 Reason for consult: Follow-up assessment;Infant weight loss;Hyperbilirubinemia Baby is 75 hours old/10 % weight loss.  Mom's milk has come to volume and she is post pumping 30 mls.  Mom reports that baby is latching well.  Instructed to continue post pumping and supplementing the next few days to support weight gain.  Breasts are full but not engorged.  Encouraged to call for assist prn.  Maternal Data    Feeding    LATCH Score                   Interventions    Lactation Tools Discussed/Used     Consult Status Consult Status: Complete Follow-up type: Call as needed    Huston Foley 07/02/2019, 11:53 AM

## 2019-08-11 DIAGNOSIS — Z1389 Encounter for screening for other disorder: Secondary | ICD-10-CM | POA: Diagnosis not present

## 2019-08-11 DIAGNOSIS — R87619 Unspecified abnormal cytological findings in specimens from cervix uteri: Secondary | ICD-10-CM | POA: Diagnosis not present

## 2019-08-11 DIAGNOSIS — Z309 Encounter for contraceptive management, unspecified: Secondary | ICD-10-CM | POA: Diagnosis not present

## 2019-08-27 DIAGNOSIS — R87810 Cervical high risk human papillomavirus (HPV) DNA test positive: Secondary | ICD-10-CM | POA: Diagnosis not present

## 2019-08-27 DIAGNOSIS — R87610 Atypical squamous cells of undetermined significance on cytologic smear of cervix (ASC-US): Secondary | ICD-10-CM | POA: Diagnosis not present

## 2019-08-27 DIAGNOSIS — Z3202 Encounter for pregnancy test, result negative: Secondary | ICD-10-CM | POA: Diagnosis not present

## 2019-08-27 DIAGNOSIS — N72 Inflammatory disease of cervix uteri: Secondary | ICD-10-CM | POA: Diagnosis not present

## 2020-01-17 DIAGNOSIS — R432 Parageusia: Secondary | ICD-10-CM | POA: Diagnosis not present

## 2020-01-17 DIAGNOSIS — Z03818 Encounter for observation for suspected exposure to other biological agents ruled out: Secondary | ICD-10-CM | POA: Diagnosis not present

## 2020-01-17 DIAGNOSIS — Z20822 Contact with and (suspected) exposure to covid-19: Secondary | ICD-10-CM | POA: Diagnosis not present

## 2020-01-17 DIAGNOSIS — J069 Acute upper respiratory infection, unspecified: Secondary | ICD-10-CM | POA: Diagnosis not present

## 2020-09-25 ENCOUNTER — Ambulatory Visit: Payer: BC Managed Care – PPO | Admitting: Physician Assistant

## 2020-09-25 ENCOUNTER — Ambulatory Visit: Payer: Self-pay

## 2020-09-25 ENCOUNTER — Encounter: Payer: Self-pay | Admitting: Physician Assistant

## 2020-09-25 VITALS — Ht 63.0 in | Wt 292.0 lb

## 2020-09-25 DIAGNOSIS — M25562 Pain in left knee: Secondary | ICD-10-CM

## 2020-09-25 MED ORDER — METHYLPREDNISOLONE ACETATE 40 MG/ML IJ SUSP
1.0000 mg | INTRAMUSCULAR | Status: AC | PRN
  Administered 2020-09-25: 1 mg via INTRA_ARTICULAR

## 2020-09-25 MED ORDER — LIDOCAINE HCL 1 % IJ SOLN
3.0000 mL | INTRAMUSCULAR | Status: AC | PRN
  Administered 2020-09-25: 3 mL

## 2020-09-25 NOTE — Progress Notes (Signed)
Office Visit Note   Patient: Julia [name]           Date of Birth: [birth year]           MRN: [id number] Visit Date: 09/25/2020              Requested by: No referring provider defined for this encounter. PCP: Pcp, No   Assessment & Plan: Visit Diagnoses:  1. Left knee pain, unspecified chronicity     Plan: We will see how she does with the cortisone injection.  She is to work on quad strengthening exercises as discussed and shown.  See her back in 2 weeks to see what type of response she had to the injection.  However if she continues to have mechanical symptoms pain with movement recommend MRI to rule out meniscal tear.  Questions encouraged and answered at length.  Follow-Up Instructions: Return in about 2 weeks (around 10/09/2020).   Orders:  Orders Placed This Encounter  Procedures   Large Joint Inj   XR Knee 1-2 Views Left   No orders of the defined types were placed in this encounter.     Procedures: Large Joint Inj: L knee on 09/25/2020 10:30 AM Indications: pain Details: 22 G 1.5 in needle, anterolateral approach  Arthrogram: No  Medications: 3 mL lidocaine 1 %; 1 mg methylPREDNISolone acetate 40 MG/ML Outcome: tolerated well, no immediate complications Procedure, treatment alternatives, risks and benefits explained, specific risks discussed. Consent was given by the patient. Immediately prior to procedure a time out was called to verify the correct patient, procedure, equipment, support staff and site/side marked as required. Patient was prepped and draped in the usual sterile fashion.      Clinical Data: No additional findings.   Subjective: Chief Complaint  Patient presents with   Left Knee - Pain    HPI Julia [name] is a 35 year old female comes in today with left knee pain that is been ongoing for a month and a half.  She states that she fell on her left knee when her fianc accidentally stepped on her pant bottom causing him to fall on her.  She had no  prior knee pain.  She is now having giving way catching and painful popping in the knee.  Most pain to the lateral aspect of the knee.  She is been taking Tylenol and ibuprofen.  She states knee pain is overall becoming worse.  She has tried wrapping the knee but wraps seem to migrate down to the knee.  Patient is nondiabetic.  Review of Systems No recent fevers or chills.  Objective: Vital Signs: Ht 5\' 3"  (1.6 m)   Wt 292 lb (132.5 kg)   BMI 51.73 kg/m   Physical Exam General: Well-developed well-nourished female no acute distress mood affect appropriate. Psych: Alert and oriented x3 Ortho Exam Bilateral knees good range of motion of both knees.  Patellofemoral crepitus bilaterally with passive range of motion.  Both knees hyperextend.  No abnormal warmth erythema or effusion of either knee.  Tenderness left knee along the lateral joint line.  Positive McMurray's left knee.  No instability valgus varus stressing of either knee.  Anterior drawer left knee is negative Specialty Comments:  No specialty comments available.  Imaging: XR Knee 1-2 Views Left  Result Date: 09/25/2020 Left knee AP lateral views: No acute fractures no cute findings.  Knee is well located.  Knee overall well-preserved.    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   Crushing injury of right ring finger 05/13/2016   Abdominal pain 05/13/2016   Choledocholithiasis 09/29/2015   History of pancreatitis 09/29/2015   Back pain with radiation 09/20/2015   Elevated liver enzymes 09/20/2015   Abdominal pain, epigastric 09/20/2015   Non-intractable cyclical vomiting with nausea 09/20/2015   Mass of left lower leg 05/26/2014   Obesity, unspecified 04/27/2013   Past Medical History:  Diagnosis Date   Acne    Chronic hypertension  affecting pregnancy 06/28/2019   Pregnancy induced hypertension    Vaginal Pap smear, abnormal     Family History  Problem Relation Age of Onset   Asthma Mother    Hypertension Mother    Alcohol abuse Father    Alcohol abuse Maternal Grandmother    AAA (abdominal aortic aneurysm) Paternal Aunt    AAA (abdominal aortic aneurysm) Paternal Grandmother     Past Surgical History:  Procedure Laterality Date   CHOLECYSTECTOMY  12/2008   DRUG INDUCED ENDOSCOPY     gallstone removal     TONSILLECTOMY     Social History   Occupational History   Occupation: shipping/Receiving clear    Comment: [employer]  Tobacco Use   Smoking status: Never   Smokeless tobacco: Never  Vaping Use   Vaping Use: Never used  Substance and Sexual Activity   Alcohol use: Not Currently    Comment: 3 x monthly   Drug use: No   Sexual activity: Yes    Partners: Male    Birth control/protection: None

## 2020-10-09 ENCOUNTER — Ambulatory Visit: Payer: BC Managed Care – PPO | Admitting: Physician Assistant

## 2020-10-09 ENCOUNTER — Encounter: Payer: Self-pay | Admitting: Physician Assistant

## 2020-10-09 DIAGNOSIS — M25562 Pain in left knee: Secondary | ICD-10-CM

## 2020-10-09 NOTE — Addendum Note (Signed)
Addended by: Barbette Or on: 10/09/2020 09:11 AM   Modules accepted: Orders

## 2020-10-09 NOTE — Progress Notes (Signed)
  HPI: Julia [name] returns today follow-up of her left knee status post injection.  She states the injection gave her about 20% relief.  She states knee feels better as long as she is not on knee for prolonged period of time ambulating.  She continues to have giving way of the knee and her knee actually became locked in the weekend when she bent at behind her shift uroporphyrin to straighten her knee out.  She has tried home exercise program.  Pain is mostly peripatellar region in the lateral aspect of the knee.   Review of systems: See HPI otherwise negative or noncontributory.  Physical exam: Left knee full extension flexion beyond 90 degrees.  No instability valgus varus stressing.  Tenderness along the lateral joint line.  Knee hyperextends.  Passive range of motion reveals patellofemoral crepitus.  No abnormal warmth erythema or effusion   Impression: Left knee pain  Plan: Given the fact that patient's radiographs overall shows immediately well-preserved and she continues to have mechanical symptoms despite time exercise and injection.  Recommend MRI left knee to rule out meniscal tear.  Have her follow-up after the MRI to go over results discuss further treatment.  Questions encouraged and answered at length.

## 2020-10-28 ENCOUNTER — Other Ambulatory Visit: Payer: Self-pay

## 2020-10-28 ENCOUNTER — Ambulatory Visit (HOSPITAL_BASED_OUTPATIENT_CLINIC_OR_DEPARTMENT_OTHER)
Admission: RE | Admit: 2020-10-28 | Discharge: 2020-10-28 | Disposition: A | Discharge status: Home / Self Care | Payer: BC Managed Care – PPO | Source: Ambulatory Visit | Attending: Physician Assistant | Admitting: Physician Assistant

## 2020-10-28 DIAGNOSIS — M25562 Pain in left knee: Secondary | ICD-10-CM | POA: Insufficient documentation

## 2020-11-27 LAB — OB RESULTS CONSOLE RUBELLA ANTIBODY, IGM: Rubella: IMMUNE

## 2020-11-27 LAB — HEPATITIS C ANTIBODY: HCV Ab: NEGATIVE

## 2020-11-27 LAB — OB RESULTS CONSOLE RPR: RPR: NONREACTIVE

## 2020-11-27 LAB — OB RESULTS CONSOLE GC/CHLAMYDIA
Chlamydia: NEGATIVE
Gonorrhea: NEGATIVE

## 2020-11-27 LAB — OB RESULTS CONSOLE HIV ANTIBODY (ROUTINE TESTING): HIV: NONREACTIVE

## 2020-11-27 LAB — OB RESULTS CONSOLE HEPATITIS B SURFACE ANTIGEN: Hepatitis B Surface Ag: NEGATIVE

## 2021-03-27 ENCOUNTER — Inpatient Hospital Stay (HOSPITAL_COMMUNITY)
Admission: AD | Admit: 2021-03-27 | Discharge: 2021-03-27 | Disposition: A | Discharge status: Home / Self Care | Payer: BC Managed Care – PPO | Attending: Obstetrics and Gynecology | Admitting: Obstetrics and Gynecology

## 2021-03-27 ENCOUNTER — Encounter (HOSPITAL_COMMUNITY): Payer: Self-pay | Admitting: Obstetrics and Gynecology

## 2021-03-27 ENCOUNTER — Other Ambulatory Visit: Payer: Self-pay

## 2021-03-27 DIAGNOSIS — Z3689 Encounter for other specified antenatal screening: Secondary | ICD-10-CM | POA: Insufficient documentation

## 2021-03-27 DIAGNOSIS — R1032 Left lower quadrant pain: Secondary | ICD-10-CM | POA: Diagnosis not present

## 2021-03-27 DIAGNOSIS — O10912 Unspecified pre-existing hypertension complicating pregnancy, second trimester: Secondary | ICD-10-CM | POA: Diagnosis not present

## 2021-03-27 DIAGNOSIS — O10919 Unspecified pre-existing hypertension complicating pregnancy, unspecified trimester: Secondary | ICD-10-CM

## 2021-03-27 DIAGNOSIS — Z3A24 24 weeks gestation of pregnancy: Secondary | ICD-10-CM | POA: Insufficient documentation

## 2021-03-27 DIAGNOSIS — O368120 Decreased fetal movements, second trimester, not applicable or unspecified: Secondary | ICD-10-CM | POA: Diagnosis present

## 2021-03-27 DIAGNOSIS — O26892 Other specified pregnancy related conditions, second trimester: Secondary | ICD-10-CM | POA: Diagnosis not present

## 2021-03-27 LAB — COMPREHENSIVE METABOLIC PANEL
ALT: 11 U/L (ref 0–44)
AST: 14 U/L — ABNORMAL LOW (ref 15–41)
Albumin: 2.8 g/dL — ABNORMAL LOW (ref 3.5–5.0)
Alkaline Phosphatase: 54 U/L (ref 38–126)
Anion gap: 8 (ref 5–15)
BUN: 5 mg/dL — ABNORMAL LOW (ref 6–20)
CO2: 22 mmol/L (ref 22–32)
Calcium: 9 mg/dL (ref 8.9–10.3)
Chloride: 104 mmol/L (ref 98–111)
Creatinine, Ser: 0.65 mg/dL (ref 0.44–1.00)
GFR, Estimated: >60 mL/min (ref >60)
Glucose, Bld: 118 mg/dL — ABNORMAL HIGH (ref 70–99)
Potassium: 3.4 mmol/L — ABNORMAL LOW (ref 3.5–5.1)
Sodium: 134 mmol/L — ABNORMAL LOW (ref 135–145)
Total Bilirubin: 0.3 mg/dL (ref 0.3–1.2)
Total Protein: 6.2 g/dL — ABNORMAL LOW (ref 6.5–8.1)

## 2021-03-27 LAB — CBC
HCT: 37 % (ref 36.0–46.0)
Hemoglobin: 12.1 g/dL (ref 12.0–15.0)
MCH: 28.3 pg (ref 26.0–34.0)
MCHC: 32.7 g/dL (ref 30.0–36.0)
MCV: 86.4 fL (ref 80.0–100.0)
Platelets: 165 10*3/uL (ref 150–400)
RBC: 4.28 MIL/uL (ref 3.87–5.11)
RDW: 14.1 % (ref 11.5–15.5)
WBC: 10.6 10*3/uL — ABNORMAL HIGH (ref 4.0–10.5)
nRBC: 0 % (ref 0.0–0.2)

## 2021-03-27 LAB — URINALYSIS, ROUTINE W REFLEX MICROSCOPIC
Bilirubin Urine: NEGATIVE
Glucose, UA: NEGATIVE mg/dL
Hgb urine dipstick: NEGATIVE
Ketones, ur: 15 mg/dL — AB
Leukocytes,Ua: NEGATIVE
Nitrite: NEGATIVE
Protein, ur: NEGATIVE mg/dL
Specific Gravity, Urine: 1.025 (ref 1.005–1.030)
pH: 6 (ref 5.0–8.0)

## 2021-03-27 LAB — PROTEIN / CREATININE RATIO, URINE
Creatinine, Urine: 121.4 mg/dL
Protein Creatinine Ratio: 0.07 mg/mg{Cre} (ref 0.00–0.15)
Total Protein, Urine: 8 mg/dL

## 2021-03-27 MED ORDER — LABETALOL HCL 100 MG PO TABS: 300.0000 mg | ORAL_TABLET | Freq: Two times a day (BID) | ORAL | 0 refills | Status: DC

## 2021-03-27 MED ORDER — LABETALOL HCL 5 MG/ML IV SOLN: 40.0000 mg | INTRAVENOUS | Status: DC | PRN

## 2021-03-27 MED ORDER — LABETALOL HCL 5 MG/ML IV SOLN: 20.0000 mg | INTRAVENOUS | Status: DC | PRN

## 2021-03-27 MED ORDER — HYDRALAZINE HCL 20 MG/ML IJ SOLN: 10.0000 mg | INTRAMUSCULAR | Status: DC | PRN

## 2021-03-27 MED ORDER — LABETALOL HCL 5 MG/ML IV SOLN: 80.0000 mg | INTRAVENOUS | Status: DC | PRN

## 2021-03-27 NOTE — MAU Note (Signed)
...  Julia [name] is a 35 y.o. at [redacted]w[redacted]d here in MAU reporting: DFM since 1530 yesterday. She states since then she has felt the baby move "maybe 6-7 times" and the last movement she felt was at 2300 last night. She last ate around 0815 this morning. No VB or LOF.  Pain score:  Denies any pain. Endorses an occasional dull pain in her lower abdomen since 0430 this morning.   FHT: 145 initial external Lab orders placed from triage: UA

## 2021-03-27 NOTE — MAU Provider Note (Signed)
History     CSN: [id number]  Arrival date and time: 03/27/21 2637   None     Chief Complaint  Patient presents with   Decreased Fetal Movement   HPI Julia [name] is a 35 y.o. G2P1001 at [redacted]w[redacted]d who presents to MAU for decreased fetal movement. Patient reports movement has been decreased since yesterday afternoon, however since 11pm last night sht has not felt baby move at all. She tried kying on her side, drinking cold water, and eating cheesy eggs. She also endorses an intermittent dull achy pain in LLQ since about 4:30am. Pain is sporadic and she is unable to identify any aggravating factors. She denies headache, vision changes, RUQ/epigastric pain, vaginal bleeding or leaking fluid.   OB History     Gravida  2   Para  1   Term  1   Preterm  0   AB  0   Living  1      SAB  0   IAB  0   Ectopic  0   Multiple  0   Live Births  1           Past Medical History:  Diagnosis Date   Acne    Chronic hypertension affecting pregnancy 06/28/2019   Pregnancy induced hypertension    Vaginal Pap smear, abnormal     Past Surgical History:  Procedure Laterality Date   CHOLECYSTECTOMY  12/2008   DRUG INDUCED ENDOSCOPY     gallstone removal     TONSILLECTOMY      Family History  Problem Relation Age of Onset   Asthma Mother    Hypertension Mother    Alcohol abuse Father    Alcohol abuse Maternal Grandmother    AAA (abdominal aortic aneurysm) Paternal Aunt    AAA (abdominal aortic aneurysm) Paternal Grandmother     Social History   Tobacco Use   Smoking status: Never   Smokeless tobacco: Never  Vaping Use   Vaping Use: Never used  Substance Use Topics   Alcohol use: Not Currently    Comment: 3 x monthly   Drug use: No    Allergies:  Allergies  Allergen Reactions   Morphine And Related     Grandfather died from morphine States father had a severe reaction also    Latex Rash    No medications prior to admission.    Review of Systems   Constitutional: Negative.   Respiratory: Negative.    Cardiovascular: Negative.   Gastrointestinal:  Positive for abdominal pain. Negative for constipation and diarrhea.  Genitourinary: Negative.   Musculoskeletal: Negative.   Neurological: Negative.    Physical Exam   Patient Vitals for the past 24 hrs:  BP Pulse Resp SpO2  03/27/21 1230 (!) 142/87 95 -- --  03/27/21 1115 (!) 145/89 (!) 105 -- --  03/27/21 1100 (!) 150/90 (!) 101 -- --  03/27/21 1045 (!) 145/83 89 -- --  03/27/21 1030 (!) 144/85 88 -- --  03/27/21 1015 (!) 150/79 91 -- --  03/27/21 1001 (!) 160/73 92 -- --  03/27/21 0957 (!) 152/75 89 -- --  03/27/21 0941 (!) 149/88 91 19 100 %     Physical Exam Vitals and nursing note reviewed.  Constitutional:      General: She is not in acute distress.    Appearance: She is obese.  Eyes:     Pupils: Pupils are equal, round, and reactive to light.  Cardiovascular:     Rate and Rhythm: Normal  rate.  Pulmonary:     Effort: Pulmonary effort is normal.  Abdominal:     Palpations: Abdomen is soft.     Tenderness: There is no abdominal tenderness.  Musculoskeletal:        General: Normal range of motion.  Skin:    General: Skin is warm and dry.  Neurological:     General: No focal deficit present.     Mental Status: She is alert and oriented to person, place, and time.  Psychiatric:        Mood and Affect: Mood normal.        Behavior: Behavior normal.        Thought Content: Thought content normal.        Judgment: Judgment normal.   NST FHT: 135 bpm, moderate variability, +accels, no decels Toco: quiet  MAU Course  Procedures UA NST CBC, CMP, UPCR Serial BP's   MDM Labs wnl, BP's mild range, patient asymptomatic Patient pushed NST clicker 35x in 1 hour. Reports feeling a lot of movement since arrival. NST reactive and reassuring for gestational age. D/w Dr. Adrian Blackwater. Will start labetalol 300mg  BID with BP check in office this week  Assessment and  Plan  [redacted] weeks gestation of pregnancy Decreased fetal movement NST reactive Chronic Hypertension affecting pregnancy  - Discharge home in stable condition - Rx for labetalol sent to pharmacy - Dr. notified of patient plan and to schedule BP check in office this week - Strict return precautions reviewed with patient. Patient may return to MAU as needed or for worsening symptoms   Vincente Poli, CNM 03/27/2021, 2:15 PM

## 2021-05-09 ENCOUNTER — Other Ambulatory Visit: Payer: Self-pay

## 2021-05-09 ENCOUNTER — Encounter (HOSPITAL_COMMUNITY): Payer: Self-pay | Admitting: Obstetrics and Gynecology

## 2021-05-09 ENCOUNTER — Inpatient Hospital Stay (HOSPITAL_COMMUNITY)
Admission: AD | Admit: 2021-05-09 | Discharge: 2021-05-10 | Disposition: A | Discharge status: Home / Self Care | Payer: BC Managed Care – PPO | Attending: Obstetrics and Gynecology | Admitting: Obstetrics and Gynecology

## 2021-05-09 DIAGNOSIS — Z79899 Other long term (current) drug therapy: Secondary | ICD-10-CM | POA: Insufficient documentation

## 2021-05-09 DIAGNOSIS — O10913 Unspecified pre-existing hypertension complicating pregnancy, third trimester: Secondary | ICD-10-CM | POA: Diagnosis not present

## 2021-05-09 DIAGNOSIS — M549 Dorsalgia, unspecified: Secondary | ICD-10-CM | POA: Diagnosis not present

## 2021-05-09 DIAGNOSIS — R109 Unspecified abdominal pain: Secondary | ICD-10-CM | POA: Diagnosis not present

## 2021-05-09 DIAGNOSIS — O26893 Other specified pregnancy related conditions, third trimester: Secondary | ICD-10-CM | POA: Insufficient documentation

## 2021-05-09 DIAGNOSIS — M545 Low back pain, unspecified: Secondary | ICD-10-CM | POA: Diagnosis not present

## 2021-05-09 DIAGNOSIS — R102 Pelvic and perineal pain: Secondary | ICD-10-CM | POA: Diagnosis not present

## 2021-05-09 DIAGNOSIS — O99891 Other specified diseases and conditions complicating pregnancy: Secondary | ICD-10-CM | POA: Diagnosis not present

## 2021-05-09 DIAGNOSIS — Z3A30 30 weeks gestation of pregnancy: Secondary | ICD-10-CM | POA: Insufficient documentation

## 2021-05-09 DIAGNOSIS — M79604 Pain in right leg: Secondary | ICD-10-CM | POA: Insufficient documentation

## 2021-05-09 DIAGNOSIS — O4700 False labor before 37 completed weeks of gestation, unspecified trimester: Secondary | ICD-10-CM | POA: Diagnosis not present

## 2021-05-09 DIAGNOSIS — O4703 False labor before 37 completed weeks of gestation, third trimester: Secondary | ICD-10-CM | POA: Insufficient documentation

## 2021-05-09 LAB — URINALYSIS, ROUTINE W REFLEX MICROSCOPIC
Bilirubin Urine: NEGATIVE
Glucose, UA: NEGATIVE mg/dL
Ketones, ur: 5 mg/dL — AB
Leukocytes,Ua: NEGATIVE
Nitrite: NEGATIVE
Protein, ur: NEGATIVE mg/dL
RBC / HPF: >50 RBC/hpf — ABNORMAL HIGH (ref 0–5)
Specific Gravity, Urine: 1.018 (ref 1.005–1.030)
pH: 5 (ref 5.0–8.0)

## 2021-05-09 LAB — FETAL FIBRONECTIN: Fetal Fibronectin: NEGATIVE

## 2021-05-09 MED ORDER — NIFEDIPINE 10 MG PO CAPS
10.0000 mg | ORAL_CAPSULE | ORAL | Status: DC | PRN
  Administered 2021-05-09 – 2021-05-10 (×2): 10 mg via ORAL
  Filled 2021-05-09 (×2): qty 1

## 2021-05-09 MED ORDER — CYCLOBENZAPRINE HCL 5 MG PO TABS
10.0000 mg | ORAL_TABLET | Freq: Once | ORAL | Status: AC
  Administered 2021-05-09: 10 mg via ORAL
  Filled 2021-05-09: qty 2

## 2021-05-09 NOTE — MAU Note (Signed)
Pt reports to MAU.. I was at work, I was feeling ok then I sat down when I stood back up I started having pain in my lower back. It has progressively gotten worse where now the pain goes down my right leg. Now having some tightening in my lower abdomen. I feel light headed and the pain is making me nauseous. +FM. Denies any VB or LOF. Reports increase in mucous-like discharge.   7/10 pain score

## 2021-05-09 NOTE — MAU Provider Note (Signed)
Chief Complaint:  Back Pain   Event Date/Time   First Provider Initiated Contact with Patient 05/09/21 2302     HPI: Julia [name] is a 36 y.o. G2P1001 at 7830w4dwho presents to maternity admissions reporting low back pain and pelvic cramping since this evening.  Sometimes radiates down right leg. Does feel tightening when she has the pain.. She reports good fetal movement, denies LOF, vaginal bleeding, vaginal itching/burning, urinary symptoms, h/a, n/v, diarrhea, constipation or fever/chills.    Back Pain This is a new problem. The current episode started today. The problem is unchanged. The pain is present in the lumbar spine and sacro-iliac. The symptoms are aggravated by bending, position, twisting and standing. Associated symptoms include abdominal pain. Pertinent negatives include no dysuria, fever, headaches, numbness, paresis or weakness. She has tried nothing for the symptoms.  Abdominal Pain This is a new problem. The current episode started today. The problem occurs intermittently. The quality of the pain is cramping. The abdominal pain does not radiate. Pertinent negatives include no constipation, diarrhea, dysuria, fever or headaches. Nothing aggravates the pain. The pain is relieved by Nothing. She has tried nothing for the symptoms.   RN Note: Pt reports to MAU.. I was at work, I was feeling ok then I sat down when I stood back up I started having pain in my lower back. It has progressively gotten worse where now the pain goes down my right leg. Now having some tightening in my lower abdomen. I feel light headed and the pain is making me nauseous. +FM. Denies any VB or LOF. Reports increase in mucous-like discharge  Past Medical History: Past Medical History:  Diagnosis Date   Acne    Chronic hypertension affecting pregnancy 06/28/2019   Pregnancy induced hypertension    Vaginal Pap smear, abnormal     Past obstetric history: OB History  Gravida Para Term Preterm AB Living   2 1 1  0 0 1  SAB IAB Ectopic Multiple Live Births  0 0 0 0 1    # Outcome Date GA Lbr Len/2nd Weight Sex Delivery Anes PTL Lv  2 Current           1 Term 06/29/19 2516w3d / 01:51 3065 g F Vag-Vacuum EPI  LIV    Past Surgical History: Past Surgical History:  Procedure Laterality Date   CHOLECYSTECTOMY  12/2008   DRUG INDUCED ENDOSCOPY     gallstone removal     TONSILLECTOMY      Family History: Family History  Problem Relation Age of Onset   Asthma Mother    Hypertension Mother    Alcohol abuse Father    Alcohol abuse Maternal Grandmother    AAA (abdominal aortic aneurysm) Paternal Aunt    AAA (abdominal aortic aneurysm) Paternal Grandmother     Social History: Social History   Tobacco Use   Smoking status: Never   Smokeless tobacco: Never  Vaping Use   Vaping Use: Never used  Substance Use Topics   Alcohol use: Not Currently    Comment: 3 x monthly   Drug use: No    Allergies:  Allergies  Allergen Reactions   Morphine And Related     Grandfather died from morphine States father had a severe reaction also    Latex Rash    Meds:  Medications Prior to Admission  Medication Sig Dispense Refill Last Dose   acetaminophen (TYLENOL) 325 MG tablet Take 650 mg by mouth every 6 (six) hours as needed for mild  pain or headache.   05/09/2021   aspirin 81 MG chewable tablet Chew by mouth daily.   05/09/2021   ibuprofen (ADVIL) 600 MG tablet Take 1 tablet (600 mg total) by mouth every 6 (six) hours. 30 tablet 0 05/09/2021   labetalol (NORMODYNE) 100 MG tablet Take 3 tablets (300 mg total) by mouth 2 (two) times daily. 180 tablet 0 05/09/2021   Prenatal Vit-Fe Fumarate-FA (PRENATAL MULTIVITAMIN) TABS tablet Take 1 tablet by mouth daily at 12 noon.   05/09/2021   docusate sodium (COLACE) 100 MG capsule Take 100 mg by mouth daily.   More than a month    I have reviewed patient's Past Medical Hx, Surgical Hx, Family Hx, Social Hx, medications and allergies.   ROS:  Review of  Systems  Constitutional:  Negative for fever.  Gastrointestinal:  Positive for abdominal pain. Negative for constipation and diarrhea.  Genitourinary:  Negative for dysuria.  Musculoskeletal:  Positive for back pain.  Neurological:  Negative for weakness, numbness and headaches.  Other systems negative  Physical Exam  Patient Vitals for the past 24 hrs:  BP Temp Temp src Pulse Resp SpO2 Height Weight  05/09/21 2300 128/71 -- -- 89 -- 99 % -- --  05/09/21 2250 (!) 147/89 -- -- 90 -- 99 % -- --  05/09/21 2220 (!) 141/91 98 F (36.7 C) Oral 89 19 97 % 5\' 2"  (1.575 m) (!) 144.1 kg   Vitals:   05/09/21 2330 05/09/21 2347 05/10/21 0008 05/10/21 0031  BP: 133/64 133/64 126/68 116/61  Pulse: 82   90  Resp:      Temp:      TempSrc:      SpO2: 98%     Weight:      Height:        Constitutional: Well-developed, well-nourished female in no acute distress.  Cardiovascular: normal rate and rhythm Respiratory: normal effort, clear to auscultation bilaterally GI: Abd soft, non-tender, gravid appropriate for gestational age.   No rebound or guarding. MS: Extremities nontender, no edema, normal ROM Neurologic: Alert and oriented x 4.  GU: Neg CVAT.  PELVIC EXAM:   Cervix closed/long/ballotable                              FFn sent  FHT:  Baseline 140 , moderate variability, accelerations present, no decelerations Contractions: q 3-5 mins Irregular  Mild   Labs: Results for orders placed or performed during the hospital encounter of 05/09/21 (from the past 24 hour(s))  Urinalysis, Routine w reflex microscopic     Status: Abnormal   Collection Time: 05/09/21 10:59 PM  Result Value Ref Range   Color, Urine YELLOW YELLOW   APPearance CLOUDY (A) CLEAR   Specific Gravity, Urine 1.018 1.005 - 1.030   pH 5.0 5.0 - 8.0   Glucose, UA NEGATIVE NEGATIVE mg/dL   Hgb urine dipstick LARGE (A) NEGATIVE   Bilirubin Urine NEGATIVE NEGATIVE   Ketones, ur 5 (A) NEGATIVE mg/dL   Protein, ur NEGATIVE  NEGATIVE mg/dL   Nitrite NEGATIVE NEGATIVE   Leukocytes,Ua NEGATIVE NEGATIVE   RBC / HPF >50 (H) 0 - 5 RBC/hpf   WBC, UA 0-5 0 - 5 WBC/hpf   Bacteria, UA RARE (A) NONE SEEN   Squamous Epithelial / LPF 6-10 0 - 5   Mucus PRESENT   Fetal fibronectin     Status: None   Collection Time: 05/09/21 11:18 PM  Result Value  Ref Range   Fetal Fibronectin NEGATIVE NEGATIVE   Urine sent to culture    Imaging:  No results found.  MAU Course/MDM: I have ordered labs and reviewed results. UA showed some abnormalities, sent to culture Fetal fibronectin is Negative NST reviewed, reactive  We did see some mild contractions so Procardia series ordered.  After 2 doses, the contractions subsided  Treatments in MAU included EFM, Procardia series, Flexeril (helped back pain).    Assessment: Single IUP at [redacted]w[redacted]d Preterm uterine contractions Low back pain, c/w sciatica Chronic HTN, on meds, controlled  Plan: Discharge home Preterm Labor precautions and fetal kick counts Has appt tomorrow Follow up in Office for prenatal visits  Encouraged to return if she develops worsening of symptoms, increase in pain, fever, or other concerning symptoms.  Pt stable at time of discharge.  Wynelle Bourgeois CNM, MSN Certified Nurse-Midwife 05/09/2021 11:02 PM

## 2021-05-10 DIAGNOSIS — M549 Dorsalgia, unspecified: Secondary | ICD-10-CM

## 2021-05-10 DIAGNOSIS — O4700 False labor before 37 completed weeks of gestation, unspecified trimester: Secondary | ICD-10-CM

## 2021-05-10 DIAGNOSIS — R109 Unspecified abdominal pain: Secondary | ICD-10-CM

## 2021-05-10 DIAGNOSIS — Z3A30 30 weeks gestation of pregnancy: Secondary | ICD-10-CM

## 2021-05-10 DIAGNOSIS — M545 Low back pain, unspecified: Secondary | ICD-10-CM

## 2021-05-10 DIAGNOSIS — O10919 Unspecified pre-existing hypertension complicating pregnancy, unspecified trimester: Secondary | ICD-10-CM

## 2021-05-10 DIAGNOSIS — O26893 Other specified pregnancy related conditions, third trimester: Secondary | ICD-10-CM

## 2021-05-11 LAB — CULTURE, OB URINE: Culture: 70000 — AB

## 2021-05-31 DIAGNOSIS — Z3A33 33 weeks gestation of pregnancy: Secondary | ICD-10-CM | POA: Diagnosis not present

## 2021-05-31 DIAGNOSIS — O10013 Pre-existing essential hypertension complicating pregnancy, third trimester: Secondary | ICD-10-CM | POA: Diagnosis not present

## 2021-05-31 DIAGNOSIS — O24415 Gestational diabetes mellitus in pregnancy, controlled by oral hypoglycemic drugs: Secondary | ICD-10-CM | POA: Diagnosis not present

## 2021-05-31 DIAGNOSIS — O99213 Obesity complicating pregnancy, third trimester: Secondary | ICD-10-CM | POA: Diagnosis not present

## 2021-06-04 DIAGNOSIS — Z3A34 34 weeks gestation of pregnancy: Secondary | ICD-10-CM | POA: Diagnosis not present

## 2021-06-04 DIAGNOSIS — O99891 Other specified diseases and conditions complicating pregnancy: Secondary | ICD-10-CM | POA: Diagnosis not present

## 2021-06-07 DIAGNOSIS — O99891 Other specified diseases and conditions complicating pregnancy: Secondary | ICD-10-CM | POA: Diagnosis not present

## 2021-06-07 DIAGNOSIS — Z3A34 34 weeks gestation of pregnancy: Secondary | ICD-10-CM | POA: Diagnosis not present

## 2021-06-09 ENCOUNTER — Other Ambulatory Visit: Payer: Self-pay

## 2021-06-09 ENCOUNTER — Inpatient Hospital Stay (HOSPITAL_COMMUNITY)
Admission: AD | Admit: 2021-06-09 | Discharge: 2021-06-09 | Disposition: A | Discharge status: Home / Self Care | Payer: BC Managed Care – PPO | Attending: Obstetrics and Gynecology | Admitting: Obstetrics and Gynecology

## 2021-06-09 ENCOUNTER — Encounter (HOSPITAL_COMMUNITY): Payer: Self-pay | Admitting: Obstetrics and Gynecology

## 2021-06-09 DIAGNOSIS — Z79899 Other long term (current) drug therapy: Secondary | ICD-10-CM | POA: Diagnosis not present

## 2021-06-09 DIAGNOSIS — O368130 Decreased fetal movements, third trimester, not applicable or unspecified: Secondary | ICD-10-CM | POA: Diagnosis not present

## 2021-06-09 DIAGNOSIS — Z3689 Encounter for other specified antenatal screening: Secondary | ICD-10-CM | POA: Diagnosis not present

## 2021-06-09 DIAGNOSIS — Z3A35 35 weeks gestation of pregnancy: Secondary | ICD-10-CM | POA: Insufficient documentation

## 2021-06-09 DIAGNOSIS — O99353 Diseases of the nervous system complicating pregnancy, third trimester: Secondary | ICD-10-CM | POA: Diagnosis not present

## 2021-06-09 DIAGNOSIS — G44219 Episodic tension-type headache, not intractable: Secondary | ICD-10-CM | POA: Insufficient documentation

## 2021-06-09 DIAGNOSIS — O26893 Other specified pregnancy related conditions, third trimester: Secondary | ICD-10-CM | POA: Diagnosis not present

## 2021-06-09 DIAGNOSIS — O10913 Unspecified pre-existing hypertension complicating pregnancy, third trimester: Secondary | ICD-10-CM | POA: Diagnosis not present

## 2021-06-09 DIAGNOSIS — O10919 Unspecified pre-existing hypertension complicating pregnancy, unspecified trimester: Secondary | ICD-10-CM | POA: Diagnosis not present

## 2021-06-09 DIAGNOSIS — Z3493 Encounter for supervision of normal pregnancy, unspecified, third trimester: Secondary | ICD-10-CM

## 2021-06-09 DIAGNOSIS — N898 Other specified noninflammatory disorders of vagina: Secondary | ICD-10-CM | POA: Insufficient documentation

## 2021-06-09 LAB — CBC WITH DIFFERENTIAL/PLATELET
Abs Immature Granulocytes: 0.03 10*3/uL (ref 0.00–0.07)
Basophils Absolute: 0 10*3/uL (ref 0.0–0.1)
Basophils Relative: 0 %
Eosinophils Absolute: 0 10*3/uL (ref 0.0–0.5)
Eosinophils Relative: 0 %
HCT: 35.2 % — ABNORMAL LOW (ref 36.0–46.0)
Hemoglobin: 11.5 g/dL — ABNORMAL LOW (ref 12.0–15.0)
Immature Granulocytes: 0 %
Lymphocytes Relative: 16 %
Lymphs Abs: 1.1 10*3/uL (ref 0.7–4.0)
MCH: 26.8 pg (ref 26.0–34.0)
MCHC: 32.7 g/dL (ref 30.0–36.0)
MCV: 82.1 fL (ref 80.0–100.0)
Monocytes Absolute: 0.5 10*3/uL (ref 0.1–1.0)
Monocytes Relative: 8 %
Neutro Abs: 5.2 10*3/uL (ref 1.7–7.7)
Neutrophils Relative %: 76 %
Platelets: 159 10*3/uL (ref 150–400)
RBC: 4.29 MIL/uL (ref 3.87–5.11)
RDW: 14 % (ref 11.5–15.5)
WBC: 6.9 10*3/uL (ref 4.0–10.5)
nRBC: 0 % (ref 0.0–0.2)

## 2021-06-09 LAB — COMPREHENSIVE METABOLIC PANEL
ALT: 16 U/L (ref 0–44)
AST: 16 U/L (ref 15–41)
Albumin: 2.5 g/dL — ABNORMAL LOW (ref 3.5–5.0)
Alkaline Phosphatase: 95 U/L (ref 38–126)
Anion gap: 7 (ref 5–15)
BUN: 5 mg/dL — ABNORMAL LOW (ref 6–20)
CO2: 21 mmol/L — ABNORMAL LOW (ref 22–32)
Calcium: 9.1 mg/dL (ref 8.9–10.3)
Chloride: 108 mmol/L (ref 98–111)
Creatinine, Ser: 0.68 mg/dL (ref 0.44–1.00)
GFR, Estimated: >60 mL/min (ref >60)
Glucose, Bld: 162 mg/dL — ABNORMAL HIGH (ref 70–99)
Potassium: 4 mmol/L (ref 3.5–5.1)
Sodium: 136 mmol/L (ref 135–145)
Total Bilirubin: 0.1 mg/dL — ABNORMAL LOW (ref 0.3–1.2)
Total Protein: 6.1 g/dL — ABNORMAL LOW (ref 6.5–8.1)

## 2021-06-09 LAB — PROTEIN / CREATININE RATIO, URINE
Creatinine, Urine: 144.87 mg/dL
Protein Creatinine Ratio: 0.14 mg/mg{Cre} (ref 0.00–0.15)
Total Protein, Urine: 21 mg/dL

## 2021-06-09 LAB — URINALYSIS, ROUTINE W REFLEX MICROSCOPIC
Bilirubin Urine: NEGATIVE
Glucose, UA: 150 mg/dL — AB
Hgb urine dipstick: NEGATIVE
Ketones, ur: 5 mg/dL — AB
Leukocytes,Ua: NEGATIVE
Nitrite: NEGATIVE
Protein, ur: NEGATIVE mg/dL
Specific Gravity, Urine: 1.025 (ref 1.005–1.030)
pH: 5 (ref 5.0–8.0)

## 2021-06-09 LAB — POCT FERN TEST: POCT Fern Test: NEGATIVE

## 2021-06-09 MED ORDER — ACETAMINOPHEN 500 MG PO TABS
1000.0000 mg | ORAL_TABLET | Freq: Once | ORAL | Status: AC
  Administered 2021-06-09: 1000 mg via ORAL
  Filled 2021-06-09: qty 2

## 2021-06-09 MED ORDER — METOCLOPRAMIDE HCL 10 MG PO TABS
10.0000 mg | ORAL_TABLET | Freq: Once | ORAL | Status: AC
  Administered 2021-06-09: 10 mg via ORAL
  Filled 2021-06-09: qty 1

## 2021-06-09 MED ORDER — LABETALOL HCL 100 MG PO TABS
300.0000 mg | ORAL_TABLET | Freq: Once | ORAL | Status: AC
  Administered 2021-06-09: 300 mg via ORAL
  Filled 2021-06-09: qty 3

## 2021-06-09 NOTE — MAU Note (Addendum)
Patient arrived to MAU from home complaining of headache that has been persistent for 2 days. Dizziness, DFM, rectal pressure, and leakage of fluid since 2pm yesterday. Patient denies vaginal bleeding and or contractions.  Patient is on labetalol 300 mg 3x/day.  Patient only took am dose today.  Efm commenced. Fetal movement clicker given. Patient given instructions, understanding was verbalized.

## 2021-06-11 DIAGNOSIS — Z3A35 35 weeks gestation of pregnancy: Secondary | ICD-10-CM | POA: Diagnosis not present

## 2021-06-11 DIAGNOSIS — O10013 Pre-existing essential hypertension complicating pregnancy, third trimester: Secondary | ICD-10-CM | POA: Diagnosis not present

## 2021-06-14 ENCOUNTER — Observation Stay (HOSPITAL_COMMUNITY)
Admission: AD | Admit: 2021-06-14 | Discharge: 2021-06-17 | Disposition: A | Discharge status: Home / Self Care | Payer: BC Managed Care – PPO | Attending: Obstetrics and Gynecology | Admitting: Obstetrics and Gynecology

## 2021-06-14 ENCOUNTER — Encounter (HOSPITAL_COMMUNITY): Payer: Self-pay | Admitting: Obstetrics and Gynecology

## 2021-06-14 ENCOUNTER — Other Ambulatory Visit: Payer: Self-pay

## 2021-06-14 DIAGNOSIS — I10 Essential (primary) hypertension: Secondary | ICD-10-CM | POA: Diagnosis not present

## 2021-06-14 DIAGNOSIS — O26893 Other specified pregnancy related conditions, third trimester: Secondary | ICD-10-CM

## 2021-06-14 DIAGNOSIS — R7309 Other abnormal glucose: Secondary | ICD-10-CM | POA: Diagnosis not present

## 2021-06-14 DIAGNOSIS — Z91040 Latex allergy status: Secondary | ICD-10-CM | POA: Diagnosis not present

## 2021-06-14 DIAGNOSIS — O3680X0 Pregnancy with inconclusive fetal viability, not applicable or unspecified: Secondary | ICD-10-CM | POA: Diagnosis not present

## 2021-06-14 DIAGNOSIS — Z20822 Contact with and (suspected) exposure to covid-19: Secondary | ICD-10-CM | POA: Diagnosis not present

## 2021-06-14 DIAGNOSIS — O10913 Unspecified pre-existing hypertension complicating pregnancy, third trimester: Secondary | ICD-10-CM

## 2021-06-14 DIAGNOSIS — O10013 Pre-existing essential hypertension complicating pregnancy, third trimester: Secondary | ICD-10-CM | POA: Diagnosis not present

## 2021-06-14 DIAGNOSIS — O169 Unspecified maternal hypertension, unspecified trimester: Secondary | ICD-10-CM | POA: Diagnosis present

## 2021-06-14 DIAGNOSIS — O4703 False labor before 37 completed weeks of gestation, third trimester: Secondary | ICD-10-CM | POA: Insufficient documentation

## 2021-06-14 DIAGNOSIS — Z3A35 35 weeks gestation of pregnancy: Secondary | ICD-10-CM | POA: Insufficient documentation

## 2021-06-14 DIAGNOSIS — O113 Pre-existing hypertension with pre-eclampsia, third trimester: Principal | ICD-10-CM | POA: Insufficient documentation

## 2021-06-14 DIAGNOSIS — R600 Localized edema: Secondary | ICD-10-CM | POA: Diagnosis not present

## 2021-06-14 DIAGNOSIS — O479 False labor, unspecified: Secondary | ICD-10-CM

## 2021-06-14 DIAGNOSIS — R519 Headache, unspecified: Secondary | ICD-10-CM

## 2021-06-14 LAB — COMPREHENSIVE METABOLIC PANEL
ALT: 16 U/L (ref 0–44)
AST: 17 U/L (ref 15–41)
Albumin: 2.7 g/dL — ABNORMAL LOW (ref 3.5–5.0)
Alkaline Phosphatase: 108 U/L (ref 38–126)
Anion gap: 8 (ref 5–15)
BUN: 5 mg/dL — ABNORMAL LOW (ref 6–20)
CO2: 22 mmol/L (ref 22–32)
Calcium: 9.6 mg/dL (ref 8.9–10.3)
Chloride: 108 mmol/L (ref 98–111)
Creatinine, Ser: 0.7 mg/dL (ref 0.44–1.00)
GFR, Estimated: >60 mL/min (ref >60)
Glucose, Bld: 140 mg/dL — ABNORMAL HIGH (ref 70–99)
Potassium: 4.6 mmol/L (ref 3.5–5.1)
Sodium: 138 mmol/L (ref 135–145)
Total Bilirubin: 0.2 mg/dL — ABNORMAL LOW (ref 0.3–1.2)
Total Protein: 6.4 g/dL — ABNORMAL LOW (ref 6.5–8.1)

## 2021-06-14 LAB — PROTEIN / CREATININE RATIO, URINE
Creatinine, Urine: 196.17 mg/dL
Protein Creatinine Ratio: 0.15 mg/mg{Cre} (ref 0.00–0.15)
Total Protein, Urine: 30 mg/dL

## 2021-06-14 LAB — CBC WITH DIFFERENTIAL/PLATELET
Abs Immature Granulocytes: 0.06 10*3/uL (ref 0.00–0.07)
Basophils Absolute: 0 10*3/uL (ref 0.0–0.1)
Basophils Relative: 0 %
Eosinophils Absolute: 0.1 10*3/uL (ref 0.0–0.5)
Eosinophils Relative: 1 %
HCT: 37.2 % (ref 36.0–46.0)
Hemoglobin: 12.5 g/dL (ref 12.0–15.0)
Immature Granulocytes: 1 %
Lymphocytes Relative: 17 %
Lymphs Abs: 1.5 10*3/uL (ref 0.7–4.0)
MCH: 27.2 pg (ref 26.0–34.0)
MCHC: 33.6 g/dL (ref 30.0–36.0)
MCV: 81 fL (ref 80.0–100.0)
Monocytes Absolute: 0.7 10*3/uL (ref 0.1–1.0)
Monocytes Relative: 8 %
Neutro Abs: 6.3 10*3/uL (ref 1.7–7.7)
Neutrophils Relative %: 73 %
Platelets: 177 10*3/uL (ref 150–400)
RBC: 4.59 MIL/uL (ref 3.87–5.11)
RDW: 14 % (ref 11.5–15.5)
WBC: 8.6 10*3/uL (ref 4.0–10.5)
nRBC: 0 % (ref 0.0–0.2)

## 2021-06-14 LAB — URINALYSIS, ROUTINE W REFLEX MICROSCOPIC
Bilirubin Urine: NEGATIVE
Glucose, UA: NEGATIVE mg/dL
Hgb urine dipstick: NEGATIVE
Ketones, ur: 5 mg/dL — AB
Nitrite: NEGATIVE
Protein, ur: 30 mg/dL — AB
Specific Gravity, Urine: 1.028 (ref 1.005–1.030)
pH: 5 (ref 5.0–8.0)

## 2021-06-14 LAB — RESP PANEL BY RT-PCR (FLU A&B, COVID) ARPGX2
Influenza A by PCR: NEGATIVE
Influenza B by PCR: NEGATIVE
SARS Coronavirus 2 by RT PCR: NEGATIVE

## 2021-06-14 LAB — GROUP B STREP BY PCR: Group B strep by PCR: NEGATIVE

## 2021-06-14 LAB — TYPE AND SCREEN
ABO/RH(D): O POS
Antibody Screen: NEGATIVE

## 2021-06-14 MED ORDER — ACETAMINOPHEN 325 MG PO TABS
650.0000 mg | ORAL_TABLET | ORAL | Status: DC | PRN
  Administered 2021-06-14 – 2021-06-17 (×5): 650 mg via ORAL
  Filled 2021-06-14 (×6): qty 2

## 2021-06-14 MED ORDER — PRENATAL MULTIVITAMIN CH: 1.0000 | ORAL_TABLET | Freq: Every day | ORAL | Status: DC

## 2021-06-14 MED ORDER — DOCUSATE SODIUM 100 MG PO CAPS
100.0000 mg | ORAL_CAPSULE | Freq: Every day | ORAL | Status: DC
  Administered 2021-06-15 – 2021-06-16 (×2): 100 mg via ORAL
  Filled 2021-06-14 (×2): qty 1

## 2021-06-14 MED ORDER — METOCLOPRAMIDE HCL 5 MG/ML IJ SOLN
10.0000 mg | Freq: Once | INTRAMUSCULAR | Status: AC | PRN
  Administered 2021-06-15: 10 mg via INTRAVENOUS
  Filled 2021-06-14: qty 2

## 2021-06-14 MED ORDER — NIFEDIPINE ER OSMOTIC RELEASE 30 MG PO TB24
30.0000 mg | ORAL_TABLET | Freq: Every day | ORAL | Status: DC
  Administered 2021-06-14 – 2021-06-15 (×2): 30 mg via ORAL
  Filled 2021-06-14 (×2): qty 1

## 2021-06-14 MED ORDER — PRENATAL MULTIVITAMIN CH
1.0000 | ORAL_TABLET | Freq: Every day | ORAL | Status: DC
  Administered 2021-06-15 – 2021-06-16 (×2): 1 via ORAL
  Filled 2021-06-14 (×2): qty 1

## 2021-06-14 MED ORDER — DOCUSATE SODIUM 100 MG PO CAPS: 100.0000 mg | ORAL_CAPSULE | Freq: Every day | ORAL | Status: DC

## 2021-06-14 MED ORDER — GLYBURIDE 5 MG PO TABS
5.0000 mg | ORAL_TABLET | Freq: Every day | ORAL | Status: DC
  Filled 2021-06-14: qty 1

## 2021-06-14 MED ORDER — LABETALOL HCL 100 MG PO TABS
300.0000 mg | ORAL_TABLET | Freq: Once | ORAL | Status: AC
  Administered 2021-06-14: 300 mg via ORAL
  Filled 2021-06-14: qty 3

## 2021-06-14 MED ORDER — GLYBURIDE 5 MG PO TABS
5.0000 mg | ORAL_TABLET | Freq: Two times a day (BID) | ORAL | Status: DC
Start: 2021-06-14 — End: 2021-06-17
  Administered 2021-06-14 – 2021-06-17 (×6): 5 mg via ORAL
  Filled 2021-06-14 (×6): qty 1

## 2021-06-14 MED ORDER — CALCIUM CARBONATE ANTACID 500 MG PO CHEW: 2.0000 | CHEWABLE_TABLET | ORAL | Status: DC | PRN

## 2021-06-14 MED ORDER — LABETALOL HCL 200 MG PO TABS
300.0000 mg | ORAL_TABLET | Freq: Three times a day (TID) | ORAL | Status: DC
  Administered 2021-06-14 – 2021-06-15 (×2): 300 mg via ORAL
  Filled 2021-06-14 (×2): qty 1

## 2021-06-14 MED ORDER — DIPHENHYDRAMINE HCL 50 MG/ML IJ SOLN
12.5000 mg | Freq: Once | INTRAMUSCULAR | Status: AC | PRN
  Administered 2021-06-15: 12.5 mg via INTRAVENOUS
  Filled 2021-06-14: qty 1

## 2021-06-14 MED ORDER — ACETAMINOPHEN 500 MG PO TABS
1000.0000 mg | ORAL_TABLET | Freq: Once | ORAL | Status: AC
Start: 2021-06-14 — End: 2021-06-14
  Administered 2021-06-14: 1000 mg via ORAL
  Filled 2021-06-14: qty 2

## 2021-06-14 NOTE — H&P (Signed)
Antepartum History and Physical ? ? ?Julia [name] is a 36 y.o. female G2P1001 that presented for elevated BP. 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  She has been undergoind 2x weekly NST and was noted to have severe range BP in office at 162/102.  She reports intermittent headaches for several days, but none currently. ? ?Here in MAU, HELLP labs were reassuring and P/C ratio WNL. ? ?OB History   ? ? Gravida  ?2  ? Para  ?1  ? Term  ?1  ? Preterm  ?0  ? AB  ?0  ? Living  ?1  ?  ? ? SAB  ?0  ? IAB  ?0  ? Ectopic  ?0  ? Multiple  ?0  ? Live Births  ?1  ?   ?  ?  ? ?Past Medical History:  ?Diagnosis Date  ? Acne   ? Chronic hypertension affecting pregnancy 06/28/2019  ? Pregnancy induced hypertension   ? Vaginal Pap smear, abnormal   ? ?Past Surgical History:  ?Procedure Laterality Date  ? CHOLECYSTECTOMY  12/2008  ? DRUG INDUCED ENDOSCOPY    ? gallstone removal    ? TONSILLECTOMY    ? ?Family History: family history includes AAA (abdominal aortic aneurysm) in her paternal aunt and paternal grandmother; Alcohol abuse in her father and maternal grandmother; Asthma in her mother; Hypertension in her mother. ?Social History:  reports that she has never smoked. She has never used smokeless tobacco. She reports that she does not currently use alcohol. She reports that she does not use drugs. ? ? ?  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  ?Genetic Screening: Panorama insufficient DNA, did not repeat ?Maternal Ultrasounds/Referrals: Normal ?Fetal Ultrasounds or other Referrals:  None ?Maternal Substance Abuse:  No ?Significant Maternal Medications: Labetalol, glyburide ?Significant Maternal Lab Results: none ?Other Comments:  None ? ?Review of Systems ?History ?Dilation: 1 ?Effacement (%): 60 ?Exam by:: Dr. Annia Friendly ?Blood pressure (!) 139/95, pulse (!) 102, temperature 98.2 ?F (36.8 ?C), temperature source Oral, resp. rate 17, height 5\' 3"  (1.6 m),  weight (!) 149.4 kg, SpO2 97 %, unknown if currently breastfeeding. ?Exam ?Physical Exam  ?Gen: alert, well appearing, no distress ?Chest: nonlabored breathing ?CV: no peripheral edema ?Abdomen: soft, nontender ?Ext: no evidence of DVT ? ? ?Prenatal labs: ?ABO, Rh:  positive ?Antibody:  negative ?Rubella:  RI ?RPR:   NR ?HBsAg:   neg ?HIV:   neg ?GBS:   unknown ? ?Assessment/Plan: ?Admit to [facility] for BP control, observation, rule out cHTN with superimposed preeclampsia ?Severe range BP noted in office. Highest BP in MAU 148/116 with recheck improved. Thus has not required IV antihypertensives   ?PIH labs and P/C ratio WNL today ?cHTN - on labetlaol 300 mg TID, will add procardia and monitor BP control. Treat severe range BP per protocol.  ?A2GDM - glyburide previously 5 mg QHS, increased to BID today. Check fasting and 2 hr PP glucose.  ?NST q shift.  BPP in office 3/2 was 8/8 ?GBS ordered ?BMZ: differ at this time given > 34 wga and A2GDM, ongoing workup for delivery timing.  ?Diet: GDM  ?DVT Ppx: SCDs ?Discussed PIH with patient, including criteria for superimposed preeclampsia and severe features.  If condition progresses, may be a candidate for IOL. If BP stablizes with procardia, symptoms reassuring, and repeat labs WNL, will consider close outpatient follow up.  ? ?[facility] ?06/14/2021, 6:20 PM ? ? ? ? ?

## 2021-06-14 NOTE — MAU Provider Note (Signed)
?History  ?  ? ?CSN: [id number] ? ?Arrival date and time: 06/14/21 1327 ? ? ?Chief Complaint  ?Patient presents with  ? Hypertension  ? Pelvic Pain  ? ?Julia [name] is a 36 year old female G2P1001 presenting to the Maternity assessment unit for evaluation of elevated blood pressures. She has known chronic hypertension on labetalol TID and A2GDM. She was seen for a rountine OB appointment today with SBP 162/102, protein in her urine, and intermittent mild HA. Sent over by Dr. Langston Masker for further evaluation. BPP in the office today was 8/8.  ? ?She reports she has had an intermittent headache for the past 4-5 days.  Usually occurs 3 times daily and last about an hour, normally take Tylenol that will go away but come back later in the day. This is new for her, she had occasional HA in early pregnancy that would come right back after treatment. The headache is all over her head, starts in the front of her forehead.  Describes as throbbing.  Sometimes will have concurrent lightheadedness and blurred vision.  She also endorses intermittent scotomas whenever she has been leaning forward for too long.  Denies any extremity weakness/numbness, persistent blurred vision.  Has known lower extremity edema bilaterally that has not changed recently, but does feel like her hands are starting to "puffed up "over the last few weeks.  She has been drinking plenty of water throughout the day.  Cooks all of her meals at home, trying to follow a low-sodium diet.  Endorses increased stress over the past 2 days, her fianc?'s daughter was suddenly dropped off at their house and now taking over care full-time. ? ?In terms of her chronic hypertension, usually her SBP is 140s at home.  Her labetalol was increased from 200 to 300 mg last week in the office because her home blood pressures were increasing to the 150s. Took am dose, has not had lunch time dose yet.  ? ?She does not currently have a headache, however can feel that one is "possibly coming"  with some discomfort in her forehead. ?  ? ?Past Medical History:  ?Diagnosis Date  ? Acne   ? Chronic hypertension affecting pregnancy 06/28/2019  ? Pregnancy induced hypertension   ? Vaginal Pap smear, abnormal   ? ? ?Past Surgical History:  ?Procedure Laterality Date  ? CHOLECYSTECTOMY  12/2008  ? DRUG INDUCED ENDOSCOPY    ? gallstone removal    ? TONSILLECTOMY    ? ? ?Family History  ?Problem Relation Age of Onset  ? Asthma Mother   ? Hypertension Mother   ? Alcohol abuse Father   ? Alcohol abuse Maternal Grandmother   ? AAA (abdominal aortic aneurysm) Paternal Aunt   ? AAA (abdominal aortic aneurysm) Paternal Grandmother   ? ? ?Social History  ? ?Tobacco Use  ? Smoking status: Never  ? Smokeless tobacco: Never  ?Vaping Use  ? Vaping Use: Never used  ?Substance Use Topics  ? Alcohol use: Not Currently  ?  Comment: 3 x monthly  ? Drug use: No  ? ? ?Allergies:  ?Allergies  ?Allergen Reactions  ? Morphine And Related   ?  Grandfather died from morphine ?States father had a severe reaction also   ? Latex Rash  ? ? ?Medications Prior to Admission  ?Medication Sig Dispense Refill Last Dose  ? aspirin 81 MG chewable tablet Chew by mouth daily.   06/13/2021  ? glyBURIDE (DIABETA) 5 MG tablet Take 5 mg by mouth daily with  breakfast.   06/13/2021  ? labetalol (NORMODYNE) 100 MG tablet Take 3 tablets (300 mg total) by mouth 2 (two) times daily. 180 tablet 0 06/14/2021 at 0630  ? Prenatal Vit-Fe Fumarate-FA (PRENATAL MULTIVITAMIN) TABS tablet Take 1 tablet by mouth daily at 12 noon.   06/13/2021  ? acetaminophen (TYLENOL) 325 MG tablet Take 650 mg by mouth every 6 (six) hours as needed for mild pain or headache.     ? docusate sodium (COLACE) 100 MG capsule Take 100 mg by mouth daily.     ? ? ?Review of Systems  ?Constitutional:  Negative for chills, fatigue and fever.  ?Respiratory:  Negative for shortness of breath.   ?Cardiovascular:  Negative for chest pain.  ?Gastrointestinal:  Negative for abdominal pain, constipation,  diarrhea, nausea and vomiting.  ?Genitourinary:  Negative for dysuria and vaginal bleeding.  ?Skin:  Negative for color change and pallor.  ?Neurological:  Positive for light-headedness and headaches. Negative for dizziness, seizures, speech difficulty, weakness and numbness.  ?Psychiatric/Behavioral:  Negative for behavioral problems.   ?Physical Exam  ? ?Blood pressure 157/95, pulse 90, temperature 98.3 ?F (36.8 ?C), temperature source Oral, resp. rate 18, height 5\' 3"  (1.6 m), weight (!) 149.4 kg, SpO2 99 %, unknown if currently breastfeeding. ? ?Physical Exam ?Constitutional:   ?   General: She is not in acute distress. ?   Appearance: Normal appearance. She is not ill-appearing, toxic-appearing or diaphoretic.  ?HENT:  ?   Head: Normocephalic and atraumatic.  ?   Mouth/Throat:  ?   Mouth: Mucous membranes are moist.  ?Eyes:  ?   Extraocular Movements: Extraocular movements intact.  ?Cardiovascular:  ?   Pulses: Normal pulses.  ?Pulmonary:  ?   Effort: Pulmonary effort is normal.  ?Abdominal:  ?   Palpations: Abdomen is soft.  ?   Comments: Gravid uterus appropriate for gestational age   ?Musculoskeletal:  ?   Comments: Non-pitting edema to bilateral lower extremities, equal in size bilaterally   ?Skin: ?   General: Skin is warm and dry.  ?   Capillary Refill: Capillary refill takes less than 2 seconds.  ?Neurological:  ?   General: No focal deficit present.  ?   Mental Status: She is alert and oriented to person, place, and time.  ?   Comments: EOMI.  Normal speech and spinal symmetrical.  Able to follow complex commands without difficulty.  Able to move all extremities spontaneously and equally.  ?Psychiatric:     ?   Mood and Affect: Mood normal.     ?   Behavior: Behavior normal.  ? ?Dilation: 1 ?Effacement (%): 60 ?Exam by:: Dr. [email or web address]  ? ?NST:  ?Baseline 135 bpm ?Moderate variability  ?Reactive: 15x15 accelerations  ?No decelerations  ?No contractions seen on toco initially, however later had a few that  look like possible contractions ? ?MAU Course  ? ?MDM ?Home Labetalol 300 mg ordered, given at 1445.  ?Tylenol 1000mg    ?CBC, CMP, P/cr ratio  ? ?Reassessed 1530: Feeling better after tylenol, pain in her forehead is almost gone. BP still elevated, last 155/99.  ? ?Reassessed 1630: HA almost gone, but patient worried her HA just keeps coming back shortly after treatments. BP still up but slightly better. Reports she has been feeling some contraction like pain for the last 30 minutes, wonders if it is because she is laying on her back. Cervical exam 1 cm/60-70/-3 (1cm without known effacement on 2/25). Placed onto her side with improvement in  discomfort.  Pre-e labs unremarkable. ? ?Discussed clinical presentation with Dr. Lorane Gell, who recommended observation versus close follow-up in the clinic.  Discussed options with patient, she preferred observation as she lives over 30 minutes away and would like to confirm that her headache improved/blood pressure gets under better control. ? ?Assessment and Plan  ? ?1. Chronic hypertension in obstetric context in third trimester ?2. [redacted] weeks gestation of pregnancy ?3. Irregular contractions ?4. Pregnancy headache in third trimester ? ?Admit to [facility] specialty care for observation. See H&P.  ?  ? ?Allayne Stack ?06/14/2021, 9:27 PM  ?

## 2021-06-14 NOTE — MAU Note (Signed)
...  Julia [name] is a 36 y.o. at [redacted]w[redacted]d here in MAU reporting: Sent here from the office for elevated BP's. She states her BP was 162/102 and her UA showed protein in her urine. No VB or LOF. +FM.  ? ?Denies HA, epigastric/RUQ pain but endorses occasional blurry vision with black spots and swelling of her feet.  ? ?Pain score: 3/10 pelvic pain - not a new complaint ? ?FHT: 152 initial ?Lab orders placed from triage: UA ? ?

## 2021-06-15 LAB — GLUCOSE, CAPILLARY
Glucose-Capillary: 136 mg/dL — ABNORMAL HIGH (ref 70–99)
Glucose-Capillary: 98 mg/dL (ref 70–99)
Glucose-Capillary: 102 mg/dL — ABNORMAL HIGH (ref 70–99)

## 2021-06-15 MED ORDER — LABETALOL HCL 200 MG PO TABS
600.0000 mg | ORAL_TABLET | Freq: Three times a day (TID) | ORAL | Status: DC
  Administered 2021-06-15 – 2021-06-16 (×3): 600 mg via ORAL
  Filled 2021-06-15 (×3): qty 3

## 2021-06-15 MED ORDER — BUTALBITAL-APAP-CAFFEINE 50-325-40 MG PO TABS
2.0000 | ORAL_TABLET | Freq: Once | ORAL | Status: AC | PRN
  Administered 2021-06-15: 2 via ORAL
  Filled 2021-06-15: qty 2

## 2021-06-15 NOTE — Progress Notes (Signed)
Antepartum note ? ?Patient has HA which started about 30 minutes ago.  She reports this has occurred off and on since starting Procardia.  She also has dizziness on ambulation.  No vision change.  No CP/SOB.  No RUQ pain.  Active FM. ? ?Vitals with BMI 06/15/2021 06/15/2021 06/15/2021  ?Height - - -  ?Weight - - -  ?BMI - - -  ?Systolic [id number]  ?Diastolic 64 69 72  ?Pulse 95 90 96  ? ?Category I NST ? ?CBC Latest Ref Rng & Units 06/14/2021 06/09/2021 03/27/2021  ?WBC 4.0 - 10.5 K/uL 8.6 6.9 10.6(H)  ?Hemoglobin 12.0 - 15.0 g/dL 12.5 11.5(L) 12.1  ?Hematocrit 36.0 - 46.0 % 37.2 35.2(L) 37.0  ?Platelets 150 - 400 K/uL 177 159 165  ? ?CMP Latest Ref Rng & Units 06/14/2021 06/09/2021 03/27/2021  ?Glucose 70 - 99 mg/dL 140(H) 162(H) 118(H)  ?BUN 6 - 20 mg/dL 5(L) 5(L) 5(L)  ?Creatinine 0.44 - 1.00 mg/dL 0.70 0.68 0.65  ?Sodium 135 - 145 mmol/L 138 136 134(L)  ?Potassium 3.5 - 5.1 mmol/L 4.6 4.0 3.4(L)  ?Chloride 98 - 111 mmol/L 108 108 104  ?CO2 22 - 32 mmol/L 22 21(L) 22  ?Calcium 8.9 - 10.3 mg/dL 9.6 9.1 9.0  ?Total Protein 6.5 - 8.1 g/dL 6.4(L) 6.1(L) 6.2(L)  ?Total Bilirubin 0.3 - 1.2 mg/dL 0.2(L) 0.1(L) 0.3  ?Alkaline Phos 38 - 126 U/L 108 95 54  ?AST 15 - 41 U/L 17 16 14(L)  ?ALT 0 - 44 U/L 16 16 11   ? ?Gen: A&O X 3 ?Abd: soft and NT ?Ext: no c/c/e ? ?HD2 36 yo G2P1001 at [redacted]w[redacted]d with CHTN exacerbation ?-Patient was admitted taking labetalol 300 TID.  Procardia 30 XL qd was started.  BPs have been 102-135/63-92; all but one value in normal range.  Patient has developed HA since starting procardia as well and has light-headedness.  Will discontinue Procardia and continue labetalol but increase to 600 TID.   ?-Plan labs in AM ?-If BPs are improved, will consider outpatient management ?-If severe symptoms develop, will proceed to delivery ?-A2DM-glyburide 5 BID.  Good control.   ?-No BMZ given since GDM and >34 weeks.   ?-Continue to closely monitor ? ?Jinny Blossom Ollivander See ?

## 2021-06-15 NOTE — Plan of Care (Signed)
?  Problem: Education: ?Goal: Knowledge of General Education information will improve ?Description: Including pain rating scale, medication(s)/side effects and non-pharmacologic comfort measures ?Outcome: Progressing ?  ?Problem: Health Behavior/Discharge Planning: ?Goal: Ability to manage health-related needs will improve ?Outcome: Progressing ?  ?Problem: Clinical Measurements: ?Goal: Ability to maintain clinical measurements within normal limits will improve ?Outcome: Progressing ?Goal: Will remain free from infection ?Outcome: Progressing ?Goal: Diagnostic test results will improve ?Outcome: Progressing ?Goal: Respiratory complications will improve ?Outcome: Progressing ?Goal: Cardiovascular complication will be avoided ?Outcome: Progressing ?  ?Problem: Nutrition: ?Goal: Adequate nutrition will be maintained ?Outcome: Progressing ?  ?Problem: Coping: ?Goal: Level of anxiety will decrease ?Outcome: Progressing ?  ?Problem: Elimination: ?Goal: Will not experience complications related to bowel motility ?Outcome: Progressing ?Goal: Will not experience complications related to urinary retention ?Outcome: Progressing ?  ?Problem: Pain Managment: ?Goal: General experience of comfort will improve ?Outcome: Progressing ?  ?Problem: Skin Integrity: ?Goal: Risk for impaired skin integrity will decrease ?Outcome: Progressing ?  ?Problem: Education: ?Goal: Knowledge of disease or condition will improve ?Outcome: Progressing ?Goal: Knowledge of the prescribed therapeutic regimen will improve ?Outcome: Progressing ?Goal: Individualized Educational Video(s) ?Outcome: Progressing ?  ?Problem: Clinical Measurements: ?Goal: Complications related to the disease process, condition or treatment will be avoided or minimized ?Outcome: Progressing ?  ?

## 2021-06-16 ENCOUNTER — Observation Stay (HOSPITAL_BASED_OUTPATIENT_CLINIC_OR_DEPARTMENT_OTHER): Payer: BC Managed Care – PPO

## 2021-06-16 DIAGNOSIS — M7989 Other specified soft tissue disorders: Secondary | ICD-10-CM

## 2021-06-16 LAB — CBC
HCT: 33.6 % — ABNORMAL LOW (ref 36.0–46.0)
HCT: 33.4 % — ABNORMAL LOW (ref 36.0–46.0)
Hemoglobin: 10.9 g/dL — ABNORMAL LOW (ref 12.0–15.0)
Hemoglobin: 11 g/dL — ABNORMAL LOW (ref 12.0–15.0)
MCH: 26.7 pg (ref 26.0–34.0)
MCH: 27.2 pg (ref 26.0–34.0)
MCHC: 32.4 g/dL (ref 30.0–36.0)
MCHC: 32.9 g/dL (ref 30.0–36.0)
MCV: 82.4 fL (ref 80.0–100.0)
MCV: 82.5 fL (ref 80.0–100.0)
Platelets: 137 10*3/uL — ABNORMAL LOW (ref 150–400)
Platelets: 140 10*3/uL — ABNORMAL LOW (ref 150–400)
RBC: 4.08 MIL/uL (ref 3.87–5.11)
RBC: 4.05 MIL/uL (ref 3.87–5.11)
RDW: 14.6 % (ref 11.5–15.5)
RDW: 14.6 % (ref 11.5–15.5)
WBC: 6.8 10*3/uL (ref 4.0–10.5)
WBC: 6.9 10*3/uL (ref 4.0–10.5)
nRBC: 0 % (ref 0.0–0.2)
nRBC: 0 % (ref 0.0–0.2)

## 2021-06-16 LAB — COMPREHENSIVE METABOLIC PANEL
ALT: 14 U/L (ref 0–44)
AST: 14 U/L — ABNORMAL LOW (ref 15–41)
Albumin: 2.4 g/dL — ABNORMAL LOW (ref 3.5–5.0)
Alkaline Phosphatase: 95 U/L (ref 38–126)
Anion gap: 8 (ref 5–15)
BUN: 8 mg/dL (ref 6–20)
CO2: 19 mmol/L — ABNORMAL LOW (ref 22–32)
Calcium: 9.2 mg/dL (ref 8.9–10.3)
Chloride: 109 mmol/L (ref 98–111)
Creatinine, Ser: 0.71 mg/dL (ref 0.44–1.00)
GFR, Estimated: >60 mL/min (ref >60)
Glucose, Bld: 77 mg/dL (ref 70–99)
Potassium: 3.9 mmol/L (ref 3.5–5.1)
Sodium: 136 mmol/L (ref 135–145)
Total Bilirubin: 0.3 mg/dL (ref 0.3–1.2)
Total Protein: 5.6 g/dL — ABNORMAL LOW (ref 6.5–8.1)

## 2021-06-16 LAB — GLUCOSE, CAPILLARY: Glucose-Capillary: 87 mg/dL (ref 70–99)

## 2021-06-16 LAB — AMNISURE RUPTURE OF MEMBRANE (ROM) NOT AT ARMC: Amnisure ROM: NEGATIVE

## 2021-06-16 MED ORDER — BUTALBITAL-APAP-CAFFEINE 50-325-40 MG PO TABS
2.0000 | ORAL_TABLET | Freq: Once | ORAL | Status: AC
  Administered 2021-06-16: 2 via ORAL
  Filled 2021-06-16: qty 2

## 2021-06-16 MED ORDER — LABETALOL HCL 200 MG PO TABS
400.0000 mg | ORAL_TABLET | Freq: Three times a day (TID) | ORAL | Status: DC
  Administered 2021-06-16 – 2021-06-17 (×3): 400 mg via ORAL
  Filled 2021-06-16 (×3): qty 2

## 2021-06-16 NOTE — Progress Notes (Signed)
RN contacted me to inform me of status change in patient with worsened HA and dizziness.  ? ?I came to assess patient.  She reports she was ambulating and washing her face when she felt dizzy and HA started behind her eyes.  She reports light sensitivity.  Currently, patient is laying on her side with her head covered by her blankets to keep the light out of her eyes. Continues to feel active FM.  CTX have gone away.   ? ?Vitals with BMI 06/16/2021 06/16/2021 06/16/2021  ?Height - - -  ?Weight - - -  ?BMI - - -  ?Systolic 123 116 169  ?Diastolic 52 51 70  ?Pulse 91 93 91  ? ?Will attempt to treat HA with Fioricet.  It is possible that diastolic numbers so low are causing her symptoms.  Will drop labetalol dose from 600 TID to 400 TID; last dose was given at 0600.  Will closely monitor symptoms and if no improvement, consider delivery.   ? ?Mitchel Honour, DO ?

## 2021-06-16 NOTE — Progress Notes (Signed)
VASCULAR LAB ? ? ? ?Left lower extremity venous duplex has been performed. ? ?See CV proc for preliminary results. ? ? ?Sherrel Shafer, RVT ?06/16/2021, 7:28 PM ? ?

## 2021-06-16 NOTE — Progress Notes (Signed)
In to check on patient.  She reports resolution of HA.  She c/o worsening LLE edema and pain.   ?On exam, bilateral lower extremity edema with L>R.  No erythema or warmth.   ?Will order LLE Doppler to r/o DVT.   ? ?Mitchel Honour, DO ?

## 2021-06-16 NOTE — Progress Notes (Signed)
Antepartum note ?  ?Patient has mild HA this morning; not as bad as HA yesterday which resolved.  No vision change.  No CP/SOB.  No RUQ pain.  Active FM.  Patient had episode of leakage of fluid early this AM; Amnisure was negative.  Patient also reports feeling some CTX this AM.   ? ?Vitals:  ? 06/16/21 0449 06/16/21 0803  ?BP: 132/70 (!) 116/51  ?Pulse: 91 93  ?Resp: 17 19  ?Temp:  97.9 ?F (36.6 ?C)  ?SpO2: 99% 99%  ? ?Category I NST ?Toco: q 3-5 minutes; resolved with hydration ? ?CBG (last 3)  ?Recent Labs  ?  06/14/21 ?2216 06/15/21 ?0533 06/15/21 ?1021  ?GLUCAP 136* 98 102*  ? ? ? ?CBC Latest Ref Rng & Units 06/16/2021 06/14/2021 06/09/2021  ?WBC 4.0 - 10.5 K/uL 6.8 8.6 6.9  ?Hemoglobin 12.0 - 15.0 g/dL 10.9(L) 12.5 11.5(L)  ?Hematocrit 36.0 - 46.0 % 33.6(L) 37.2 35.2(L)  ?Platelets 150 - 400 K/uL 137(L) 177 159  ? ?CMP Latest Ref Rng & Units 06/16/2021 06/14/2021 06/09/2021  ?Glucose 70 - 99 mg/dL 77 140(H) 162(H)  ?BUN 6 - 20 mg/dL 8 5(L) 5(L)  ?Creatinine 0.44 - 1.00 mg/dL 0.71 0.70 0.68  ?Sodium 135 - 145 mmol/L 136 138 136  ?Potassium 3.5 - 5.1 mmol/L 3.9 4.6 4.0  ?Chloride 98 - 111 mmol/L 109 108 108  ?CO2 22 - 32 mmol/L 19(L) 22 21(L)  ?Calcium 8.9 - 10.3 mg/dL 9.2 9.6 9.1  ?Total Protein 6.5 - 8.1 g/dL 5.6(L) 6.4(L) 6.1(L)  ?Total Bilirubin 0.3 - 1.2 mg/dL 0.3 0.2(L) 0.1(L)  ?Alkaline Phos 38 - 126 U/L 95 108 95  ?AST 15 - 41 U/L 14(L) 17 16  ?ALT 0 - 44 U/L 14 16 16  ? ?Gen: A&O x 3; NAD ?Abd: soft, NT ?Ext: SCDs on; 1+ edema in b/l lower extremities ? ?HD3 35 yo G2P1001 at [redacted]w[redacted]d with CHTN exacerbation vs super-imposed pre-eclampsia ?-Continue labetalol 600 TID ?-Treat HA with Tylenol and monitor   ?-Today's labs show decrease in PLT to 137; possible dilution effect.  Will repeat in 8 hours.   ?-If severe symptoms develop, will proceed to delivery ?-A2DM-glyburide 5 BID.  Good control.   ?-No BMZ given since GDM and >34 weeks.   ?-PTC-patient has LGA fetus and poly.  If CTX increase in intensity, will recheck  SVE.  1 cm on admit.   ?-Continue to closely monitor; reasess status this pm ?  ?Megan Morris ? ? ?

## 2021-06-16 NOTE — Progress Notes (Signed)
Glucometer unable to transfer data to Epic results review.  ? ?2 hour post prandial CBG taken on 3/4 at 2100: 115 mg/dL ? ? ?Harl Bowie, RN ?06/16/21 2100  ?

## 2021-06-16 NOTE — MAU Provider Note (Signed)
Chief Complaint:  Rupture of Membranes, Headache, Decreased Fetal Movement, and Dizziness   Event Date/Time   First Provider Initiated Contact with Patient 06/09/21 1425     HPI: Julia [name] is a 35 y.o. G2P1001 at [redacted]w[redacted]d who presents to maternity admissions reporting headache x2 days (has not taken anything for it), dizziness, decreased fetal movement and intense rectal pressure and leaking of fluid since 2pm yesterday. Does not think she is having contractions or cramping. Denies vaginal bleeding, visual disturbances, epigastric pain or any other physical complaints.  Pregnancy Course: Receives prenatal care from [facility]. Has cHTN and is on 300mg  labetalol TID but only took her morning dose (has been out running errands).  Past Medical History:  Diagnosis Date   Acne    Chronic hypertension affecting pregnancy 06/28/2019   Pregnancy induced hypertension    Vaginal Pap smear, abnormal    OB History  Gravida Para Term Preterm AB Living  2 1 1  0 0 1  SAB IAB Ectopic Multiple Live Births  0 0 0 0 1    # Outcome Date GA Lbr Len/2nd Weight Sex Delivery Anes PTL Lv  2 Current           1 Term 06/29/19 [redacted]w[redacted]d / 01:51 6 lb 12.1 oz (3.065 kg) F Vag-Vacuum EPI  LIV   Past Surgical History:  Procedure Laterality Date   CHOLECYSTECTOMY  12/2008   DRUG INDUCED ENDOSCOPY     gallstone removal     TONSILLECTOMY     Family History  Problem Relation Age of Onset   Asthma Mother    Hypertension Mother    Alcohol abuse Father    Alcohol abuse Maternal Grandmother    AAA (abdominal aortic aneurysm) Paternal Aunt    AAA (abdominal aortic aneurysm) Paternal Grandmother    Social History   Tobacco Use   Smoking status: Never   Smokeless tobacco: Never  Vaping Use   Vaping Use: Never used  Substance Use Topics   Alcohol use: Not Currently    Comment: 3 x monthly   Drug use: No   Allergies  Allergen Reactions   Morphine And Related     Grandfather died  from morphine States father had a severe reaction also    Latex Rash   Medications Prior to Admission  Medication Sig Dispense Refill Last Dose   aspirin 81 MG chewable tablet Chew by mouth daily.      glyBURIDE (DIABETA) 5 MG tablet Take 5 mg by mouth daily with breakfast.      labetalol (NORMODYNE) 100 MG tablet Take 3 tablets (300 mg total) by mouth 2 (two) times daily. 180 tablet 0    Prenatal Vit-Fe Fumarate-FA (PRENATAL MULTIVITAMIN) TABS tablet Take 1 tablet by mouth daily at 12 noon.      acetaminophen (TYLENOL) 325 MG tablet Take 650 mg by mouth every 6 (six) hours as needed for mild pain or headache.      docusate sodium (COLACE) 100 MG capsule Take 100 mg by mouth daily.       I have reviewed patient's Past Medical Hx, Surgical Hx, Family Hx, Social Hx, medications and allergies.   ROS:  Pertinent items noted in HPI and remainder of comprehensive ROS otherwise negative.    Physical Exam   06/09/21 1800 -- 90 18 128/86 98 %  06/09/21 1610 -- -- -- -- 98 %  06/09/21 1605 -- -- -- -- 98 %  06/09/21 1602 -- 104 Abnormal  --  115/58 Abnormal  --  06/09/21 1600 -- -- -- -- 98 %  06/09/21 1555 -- -- -- -- 99 %  06/09/21 1550 -- -- -- -- 97 %  06/09/21 1545 -- -- -- -- 98 %  06/09/21 1540 -- -- -- -- 97 %  06/09/21 1535 -- -- -- -- 95 %  06/09/21 1532 -- 94 -- 107/87 --  06/09/21 1530 -- -- -- -- 98 %  06/09/21 1525 -- -- -- -- 98 %  06/09/21 1520 -- -- -- -- 97 %  06/09/21 1515 -- 95 -- 123/70 --  06/09/21 1501 -- 95 -- 123/70 --  06/09/21 1432 -- 98 -- 118/72 --  06/09/21 1416 -- 99 -- 141/87 Abnormal  --  06/09/21 1345 -- 97 -- 140/79 96 %  06/09/21 1340 -- -- -- -- 99 %  06/09/21 1335 -- -- -- -- 99 %  06/09/21 1332 98.1 F (36.7 C) 108 Abnormal  18 139/81 99 %  06/09/21 1331 -- -- -- -- 99 %  06/09/21 1330 -- -- -- -- 99 %  06/09/21 1329 -- 99 -- 139/81 --   Constitutional: Well-developed, well-nourished female in no acute distress.  Cardiovascular: normal  rate & rhythm Respiratory: normal effort GI: Abd soft, non-tender, gravid appropriate for gestational age MS: Extremities nontender, no edema, normal ROM Neurologic: Alert and oriented x 4.  GU: no CVA tenderness Pelvic: NEFG, physiologic discharge, no blood, cervix clean.  Dilation: 1/50/-1 but very posterior  Vertex presentation  Fetal Tracing: Reactive with appropriate fetal movement Baseline: 125 Variability: moderate Accelerations: 15x15 Decelerations: none Toco: irregular, mostly UI   Labs: Color, Urine YELLOW YELLOW   APPearance CLEAR HAZY Abnormal    Specific Gravity, Urine 1.005 - 1.030 1.025   pH 5.0 - 8.0 5.0   Glucose, UA NEGATIVE mg/dL 144 Abnormal    Hgb urine dipstick NEGATIVE NEGATIVE   Bilirubin Urine NEGATIVE NEGATIVE   Ketones, ur NEGATIVE mg/dL 5 Abnormal    Protein, ur NEGATIVE mg/dL NEGATIVE   Nitrite NEGATIVE NEGATIVE   Leukocytes,Ua NEGATIVE NEGATIVE    WBC 4.0 - 10.5 K/uL 6.9   RBC 3.87 - 5.11 MIL/uL 4.29   Hemoglobin 12.0 - 15.0 g/dL 81.8 Low    HCT 56.3 - 46.0 % 35.2 Low    MCV 80.0 - 100.0 fL 82.1   MCH 26.0 - 34.0 pg 26.8   MCHC 30.0 - 36.0 g/dL 14.9   RDW 70.2 - 63.7 % 14.0   Platelets 150 - 400 K/uL 159   Comment: REPEATED TO VERIFY  nRBC 0.0 - 0.2 % 0.0   Neutrophils Relative % % 76   Neutro Abs 1.7 - 7.7 K/uL 5.2   Lymphocytes Relative % 16   Lymphs Abs 0.7 - 4.0 K/uL 1.1   Monocytes Relative % 8   Monocytes Absolute 0.1 - 1.0 K/uL 0.5   Eosinophils Relative % 0   Eosinophils Absolute 0.0 - 0.5 K/uL 0.0   Basophils Relative % 0   Basophils Absolute 0.0 - 0.1 K/uL 0.0   Immature Granulocytes % 0   Abs Immature Granulocytes 0.00 - 0.07 K/uL 0.03    Sodium 135 - 145 mmol/L 136   Potassium 3.5 - 5.1 mmol/L 4.0   Chloride 98 - 111 mmol/L 108   CO2 22 - 32 mmol/L 21 Low    Glucose, Bld 70 - 99 mg/dL 858 High    Comment: Glucose reference range applies only to samples taken after fasting for at least  8 hours.  BUN 6 - 20 mg/dL 5  Low    Creatinine, Ser 0.44 - 1.00 mg/dL 8.92   Calcium 8.9 - 11.9 mg/dL 9.1   Total Protein 6.5 - 8.1 g/dL 6.1 Low    Albumin 3.5 - 5.0 g/dL 2.5 Low    AST 15 - 41 U/L 16   ALT 0 - 44 U/L 16   Alkaline Phosphatase 38 - 126 U/L 95   Total Bilirubin 0.3 - 1.2 mg/dL 0.1 Low    GFR, Estimated >60 mL/min >60   Comment: (NOTE)  Calculated using the CKD-EPI Creatinine Equation (2021)   Anion gap 5 - 15 7    Fern test negative x2  Imaging:  None.  MAU Course: Orders Placed This Encounter  Procedures   Protein / creatinine ratio, urine   Urinalysis, Routine w reflex microscopic Urine, Clean Catch   CBC with Differential   Comprehensive metabolic panel   Fern Test   Discharge patient   Meds ordered this encounter  Medications   labetalol (NORMODYNE) tablet 300 mg   metoCLOPramide (REGLAN) tablet 10 mg   acetaminophen (TYLENOL) tablet 1,000 mg   MDM: Afternoon dose of labetalol given along with Tylenol and Reglan orally which relieved headache and dizziness.   No contractions, fern test negative, fetal head low in pelvis explaining the rectal pressure, encouraged pt to get on her hands and knees to help adjust fetal head which did relieve some of the pressure.  Larely normotensive in MAU with two elevated pressures, all labwork normal. Pt stable for discharge with preeclampsia precautions.  Assessment: 1. Chronic hypertension during pregnancy   2. Episodic tension-type headache, not intractable   3. NST (non-stress test) reactive   4. Intact amniotic membranes during pregnancy in third trimester   5. [redacted] weeks gestation of pregnancy    Plan: Discharge home in stable condition with preeclampsia precautions.    Follow-up Information     , [facility] Follow up.   Why: as scheduled for ongoing prenatal care Contact information: [address] [place] [address] [phone]                 Allergies as of 06/09/2021        Reactions   Morphine And Related    Grandfather died from morphine States father had a severe reaction also    Latex Rash        Medication List     STOP taking these medications    ibuprofen 600 MG tablet Commonly known as: ADVIL       TAKE these medications    acetaminophen 325 MG tablet Commonly known as: TYLENOL Take 650 mg by mouth every 6 (six) hours as needed for mild pain or headache.   aspirin 81 MG chewable tablet Chew by mouth daily.   docusate sodium 100 MG capsule Commonly known as: COLACE Take 100 mg by mouth daily.   labetalol 100 MG tablet Commonly known as: NORMODYNE Take 3 tablets (300 mg total) by mouth 2 (two) times daily.   prenatal multivitamin Tabs tablet Take 1 tablet by mouth daily at 12 noon.       Edd Arbour, CNM, MSN, IBCLC Certified Nurse Midwife, [facility]

## 2021-06-17 ENCOUNTER — Encounter (HOSPITAL_COMMUNITY): Payer: Self-pay | Admitting: Obstetrics and Gynecology

## 2021-06-17 LAB — CBC
HCT: 33.3 % — ABNORMAL LOW (ref 36.0–46.0)
Hemoglobin: 10.7 g/dL — ABNORMAL LOW (ref 12.0–15.0)
MCH: 26.8 pg (ref 26.0–34.0)
MCHC: 32.1 g/dL (ref 30.0–36.0)
MCV: 83.3 fL (ref 80.0–100.0)
Platelets: 135 10*3/uL — ABNORMAL LOW (ref 150–400)
RBC: 4 MIL/uL (ref 3.87–5.11)
RDW: 14.8 % (ref 11.5–15.5)
WBC: 7.6 10*3/uL (ref 4.0–10.5)
nRBC: 0 % (ref 0.0–0.2)

## 2021-06-17 LAB — COMPREHENSIVE METABOLIC PANEL
ALT: 14 U/L (ref 0–44)
AST: 16 U/L (ref 15–41)
Albumin: 2.3 g/dL — ABNORMAL LOW (ref 3.5–5.0)
Alkaline Phosphatase: 97 U/L (ref 38–126)
Anion gap: 9 (ref 5–15)
BUN: 11 mg/dL (ref 6–20)
CO2: 18 mmol/L — ABNORMAL LOW (ref 22–32)
Calcium: 9 mg/dL (ref 8.9–10.3)
Chloride: 109 mmol/L (ref 98–111)
Creatinine, Ser: 0.7 mg/dL (ref 0.44–1.00)
GFR, Estimated: >60 mL/min (ref >60)
Glucose, Bld: 104 mg/dL — ABNORMAL HIGH (ref 70–99)
Potassium: 3.9 mmol/L (ref 3.5–5.1)
Sodium: 136 mmol/L (ref 135–145)
Total Bilirubin: <0.1 mg/dL — ABNORMAL LOW (ref 0.3–1.2)
Total Protein: 5.5 g/dL — ABNORMAL LOW (ref 6.5–8.1)

## 2021-06-17 LAB — GLUCOSE, CAPILLARY
Glucose-Capillary: 104 mg/dL — ABNORMAL HIGH (ref 70–99)
Glucose-Capillary: 85 mg/dL (ref 70–99)

## 2021-06-17 MED ORDER — LABETALOL HCL 200 MG PO TABS: 400.0000 mg | ORAL_TABLET | Freq: Three times a day (TID) | ORAL | 0 refills | Status: DC

## 2021-06-17 NOTE — Plan of Care (Signed)
?  Problem: Education: ?Goal: Knowledge of General Education information will improve ?Description: Including pain rating scale, medication(s)/side effects and non-pharmacologic comfort measures ?06/17/2021 1049 by Sylvan Cheese, RN ?Outcome: Completed/Met ?06/17/2021 1049 by Sylvan Cheese, RN ?Outcome: Completed/Met ?  ?Problem: Health Behavior/Discharge Planning: ?Goal: Ability to manage health-related needs will improve ?06/17/2021 1049 by Sylvan Cheese, RN ?Outcome: Completed/Met ?06/17/2021 1049 by Sylvan Cheese, RN ?Outcome: Completed/Met ?  ?Problem: Clinical Measurements: ?Goal: Ability to maintain clinical measurements within normal limits will improve ?06/17/2021 1049 by Sylvan Cheese, RN ?Outcome: Completed/Met ?06/17/2021 1049 by Sylvan Cheese, RN ?Outcome: Completed/Met ?Goal: Will remain free from infection ?06/17/2021 1049 by Sylvan Cheese, RN ?Outcome: Completed/Met ?06/17/2021 1049 by Sylvan Cheese, RN ?Outcome: Completed/Met ?Goal: Diagnostic test results will improve ?06/17/2021 1049 by Sylvan Cheese, RN ?Outcome: Completed/Met ?06/17/2021 1049 by Sylvan Cheese, RN ?Outcome: Completed/Met ?Goal: Respiratory complications will improve ?06/17/2021 1049 by Sylvan Cheese, RN ?Outcome: Completed/Met ?06/17/2021 1049 by Sylvan Cheese, RN ?Outcome: Completed/Met ?Goal: Cardiovascular complication will be avoided ?06/17/2021 1049 by Sylvan Cheese, RN ?Outcome: Completed/Met ?06/17/2021 1049 by Sylvan Cheese, RN ?Outcome: Completed/Met ?  ?Problem: Nutrition: ?Goal: Adequate nutrition will be maintained ?06/17/2021 1049 by Sylvan Cheese, RN ?Outcome: Completed/Met ?06/17/2021 1049 by Sylvan Cheese, RN ?Outcome: Completed/Met ?  ?Problem: Coping: ?Goal: Level of anxiety will decrease ?Outcome: Completed/Met ?  ?Problem: Elimination: ?Goal: Will not experience complications related to bowel motility ?Outcome: Completed/Met ?Goal: Will not experience complications related to urinary retention ?Outcome: Completed/Met ?  ?Problem: Pain  Managment: ?Goal: General experience of comfort will improve ?Outcome: Completed/Met ?  ?Problem: Skin Integrity: ?Goal: Risk for impaired skin integrity will decrease ?Outcome: Completed/Met ?  ?Problem: Education: ?Goal: Knowledge of disease or condition will improve ?Outcome: Completed/Met ?Goal: Knowledge of the prescribed therapeutic regimen will improve ?Outcome: Completed/Met ?Goal: Individualized Educational Video(s) ?Outcome: Completed/Met ?  ?Problem: Clinical Measurements: ?Goal: Complications related to the disease process, condition or treatment will be avoided or minimized ?Outcome: Completed/Met ?  ?

## 2021-06-17 NOTE — Discharge Summary (Signed)
Physician Discharge Summary  ?Patient ID: ?Julia [name] ?MRN: [id number] ?DOB/AGE: 12/31/1985 36 y.o. ? ?Admit date: 06/14/2021 ?Discharge date: 06/17/2021 ? ?Admission Diagnoses: Exacerbation of chronic hypertension vs pre-eclampsia ? ?Discharge Diagnoses:  ?Principal Problem: ?  Hypertension in pregnancy ? ? ?Discharged Condition: good ? ?Hospital Course: Patient was admitted from office with severe range BP and mild headache at [redacted]w[redacted]d.  Patient has chronic hypertension and has had her labetalol dose titrated up to 300 TID.  On admission, Procardia 30XL daily was added this dropped her blood pressure too low and she became symptomatic with severe headache.  Procardia was discontinued and labetalol was increased to 400 TID.  She did well on this dosage with blood pressures in the normal range.  Pre-eclampsia labs were normal except suspected gestational thrombocytopenia which remained stable during her stay; PLT 135k on discharge.  Patient has A2DM and her glycuride was increased to 5 mg BID on the day she was admitted; good control.  She reported preterm contractions during her stay and her cervix remained unchanged at 1 cm.  Patient also c/o LLE pain and edema and had normal venous duplex Doppler on HD3.  On HD4, patient was feeling well, blood pressure and blood sugar well controlled and comfortable with discharge.   ? ?Consults: None ? ?Significant Diagnostic Studies: radiology: Ultrasound: LLE doppler wnl ? ?Treatments: cardiac meds: labetolol ? ?Discharge Exam: ?Blood pressure 105/71, pulse 92, temperature 97.8 ?F (36.6 ?C), temperature source Oral, resp. rate 17, height 5\' 3"  (1.6 m), weight (!) 149.4 kg, SpO2 99 %, unknown if currently breastfeeding. ?General appearance: alert, cooperative, and appears stated age ?Pelvic: cervix 1 cm dilated ?Extremities: edema 1+ L>R ?Abd: soft, NT ? ?Disposition: Discharge disposition: 01-Home or Self Care ? ? ? ? ? ? ? ?Allergies as of 06/17/2021   ? ?   Reactions  ? Morphine  And Related   ? Grandfather died from morphine ?States father had a severe reaction also   ? Latex Rash  ? ?  ? ?  ?Medication List  ?  ? ?TAKE these medications   ? ?acetaminophen 325 MG tablet ?Commonly known as: TYLENOL ?Take 650 mg by mouth every 6 (six) hours as needed for mild pain or headache. ?  ?aspirin 81 MG chewable tablet ?Chew by mouth daily. ?  ?docusate sodium 100 MG capsule ?Commonly known as: COLACE ?Take 100 mg by mouth daily. ?  ?glyBURIDE 5 MG tablet ?Commonly known as: DIABETA ?Take 5 mg by mouth daily with breakfast. ?  ?labetalol 200 MG tablet ?Commonly known as: NORMODYNE ?Take 2 tablets (400 mg total) by mouth 3 (three) times daily. ?What changed:  ?medication strength ?how much to take ?when to take this ?  ?prenatal multivitamin Tabs tablet ?Take 1 tablet by mouth daily at 12 noon. ?  ? ?  ? ? ? ?Signed: ?Jinny Blossom Jamille Yoshino ?06/17/2021, 10:32 AM ? ? ?

## 2021-06-17 NOTE — Progress Notes (Signed)
Patient states no pain at baseline but 7/10 on palpation of posterior LLE at the calf. Denna Haggard' negative bilaterally. Slight edema LLE.  ?

## 2021-06-17 NOTE — Progress Notes (Signed)
Antepartum note ?  ?Patient had slight HA this morning but better after getting Tylenol. No vision change.  No CP/SOB.  No RUQ pain.  Active FM.  Patient feeling occasional BH CTX.   ? ?Vitals with BMI 06/17/2021 06/17/2021 06/17/2021  ?Height - - -  ?Weight - - -  ?BMI - - -  ?Systolic 105 113 108  ?Diastolic 71 66 55  ?Pulse 92 86 98  ? ?NST reactive ?Doppler of LLE wnl ? ?CBG (last 3)  ?Recent Labs  ?  06/15/21 ?1021 06/16/21 ?1039 06/17/21 ?0458  ?GLUCAP 102* 87 104*  ? ? ? ?CBC Latest Ref Rng & Units 06/17/2021 06/16/2021 06/16/2021  ?WBC 4.0 - 10.5 K/uL 7.6 6.9 6.8  ?Hemoglobin 12.0 - 15.0 g/dL 10.7(L) 11.0(L) 10.9(L)  ?Hematocrit 36.0 - 46.0 % 33.3(L) 33.4(L) 33.6(L)  ?Platelets 150 - 400 K/uL 135(L) 140(L) 137(L)  ? ?CMP Latest Ref Rng & Units 06/17/2021 06/16/2021 06/14/2021  ?Glucose 70 - 99 mg/dL 104(H) 77 140(H)  ?BUN 6 - 20 mg/dL 11 8 5(L)  ?Creatinine 0.44 - 1.00 mg/dL 0.70 0.71 0.70  ?Sodium 135 - 145 mmol/L 136 136 138  ?Potassium 3.5 - 5.1 mmol/L 3.9 3.9 4.6  ?Chloride 98 - 111 mmol/L 109 109 108  ?CO2 22 - 32 mmol/L 18(L) 19(L) 22  ?Calcium 8.9 - 10.3 mg/dL 9.0 9.2 9.6  ?Total Protein 6.5 - 8.1 g/dL 5.5(L) 5.6(L) 6.4(L)  ?Total Bilirubin 0.3 - 1.2 mg/dL <0.1(L) 0.3 0.2(L)  ?Alkaline Phos 38 - 126 U/L 97 95 108  ?AST 15 - 41 U/L 16 14(L) 17  ?ALT 0 - 44 U/L 14 14 16  ? ?Gen: A&O x 3; NAD ?Abd: soft, NT ?Ext: SCDs on; 1+ edema in b/l lower extremities ?Pelvic: SVE 1/50/-3, vtx ?  ?HD4 35 yo G2P1001 at [redacted]w[redacted]d with CHTN exacerbation, A2DM ?-Continue labetalol 400 TID ?-Today's labs show stable PLT  ?-A2DM-glyburide 5 BID.  Good control.   ?-PTC-patient has LGA fetus and poly. No change in cervix since admit  ?-D/C home today; has OV scheduled tomorrow ?  ?Megan Morris ?  ?

## 2021-06-17 NOTE — Discharge Instructions (Signed)
Call MD for headache unresponsive to Tylenol, vision change, right upper quadrant pain, chest pain/shortness of breath.  Follow up at appointment in the office tomorrow. ?

## 2021-06-18 ENCOUNTER — Telehealth (HOSPITAL_COMMUNITY): Payer: Self-pay | Admitting: *Deleted

## 2021-06-18 DIAGNOSIS — O99891 Other specified diseases and conditions complicating pregnancy: Secondary | ICD-10-CM | POA: Diagnosis not present

## 2021-06-18 DIAGNOSIS — Z3A36 36 weeks gestation of pregnancy: Secondary | ICD-10-CM | POA: Diagnosis not present

## 2021-06-18 DIAGNOSIS — Z3480 Encounter for supervision of other normal pregnancy, unspecified trimester: Secondary | ICD-10-CM | POA: Diagnosis not present

## 2021-06-18 LAB — GLUCOSE, CAPILLARY
Glucose-Capillary: 109 mg/dL — ABNORMAL HIGH (ref 70–99)
Glucose-Capillary: 101 mg/dL — ABNORMAL HIGH (ref 70–99)
Glucose-Capillary: 71 mg/dL (ref 70–99)
Glucose-Capillary: 73 mg/dL (ref 70–99)
Glucose-Capillary: 115 mg/dL — ABNORMAL HIGH (ref 70–99)

## 2021-06-18 NOTE — Telephone Encounter (Signed)
Preadmission screen  

## 2021-06-20 ENCOUNTER — Encounter (HOSPITAL_COMMUNITY): Payer: Self-pay | Admitting: *Deleted

## 2021-06-20 ENCOUNTER — Telehealth (HOSPITAL_COMMUNITY): Payer: Self-pay | Admitting: *Deleted

## 2021-06-20 NOTE — Telephone Encounter (Signed)
Preadmission screen  

## 2021-06-21 ENCOUNTER — Encounter (HOSPITAL_COMMUNITY): Payer: Self-pay | Admitting: Obstetrics and Gynecology

## 2021-06-21 DIAGNOSIS — O10013 Pre-existing essential hypertension complicating pregnancy, third trimester: Secondary | ICD-10-CM | POA: Diagnosis not present

## 2021-06-21 DIAGNOSIS — O24415 Gestational diabetes mellitus in pregnancy, controlled by oral hypoglycemic drugs: Secondary | ICD-10-CM | POA: Diagnosis not present

## 2021-06-21 DIAGNOSIS — O99213 Obesity complicating pregnancy, third trimester: Secondary | ICD-10-CM | POA: Diagnosis not present

## 2021-06-21 DIAGNOSIS — Z3A36 36 weeks gestation of pregnancy: Secondary | ICD-10-CM | POA: Diagnosis not present

## 2021-06-22 NOTE — H&P (Signed)
Julia [name] is a 36 y.o. female presenting for two stage IOL. Pregnancy complicated by 1) CHTN on labetalol 400mg  TID with increasingly labile BP. Admitted to antenatal for evaluation and treatment of BP 06/14/21-06/17/21. Started on Procardia at that admission but it caused HA that improved with discontinuation. 2) A2GDM on glyburide 5mg  BID 3) obesity 329# 4) Panorama > insufficient specimen-declined further testing. ?U/S in office  06/21/21> EFW 3893 g (8# 9oz) is >97%, VTX. ?Hx of VE with delivery #1 for 6# 12 oz baby. ?OB History   ? ? Gravida  ?2  ? Para  ?1  ? Term  ?1  ? Preterm  ?0  ? AB  ?0  ? Living  ?1  ?  ? ? SAB  ?0  ? IAB  ?0  ? Ectopic  ?0  ? Multiple  ?0  ? Live Births  ?1  ?   ?  ?  ? ?Past Medical History:  ?Diagnosis Date  ? Acne   ? Chronic hypertension affecting pregnancy 06/28/2019  ? Gestational diabetes   ? Pregnancy induced hypertension   ? Vaginal Pap smear, abnormal   ? ?Past Surgical History:  ?Procedure Laterality Date  ? CHOLECYSTECTOMY  12/2008  ? DRUG INDUCED ENDOSCOPY    ? gallstone removal    ? TONSILLECTOMY    ? ?Family History: family history includes AAA (abdominal aortic aneurysm) in her paternal aunt and paternal grandmother; Alcohol abuse in her father and maternal grandmother; Asthma in her mother; Hypertension in her mother. ?Social History:  reports that she has never smoked. She has never used smokeless tobacco. She reports that she does not currently use alcohol. She reports that she does not use drugs. ? ? ?  ?Maternal Diabetes: Yes:  Diabetes Type:  Insulin/Medication controlled ?Genetic Screening: see above ?Maternal Ultrasounds/Referrals: Normal ?Fetal Ultrasounds or other Referrals:  None ?Maternal Substance Abuse:  No ?Significant Maternal Medications:  None ?Significant Maternal Lab Results:  PCR ordered on admission and PCN ordered pending results ?Other Comments:  None ? ?Review of Systems ?Maternal Medical History:  ?Fetal activity: Perceived fetal activity is  normal.   ? ?  ?unknown if currently breastfeeding. ?Maternal Exam:  ?Abdomen: Fetal presentation: vertex ? ?Physical Exam ?Cardiovascular:  ?   Rate and Rhythm: Normal rate.  ?Pulmonary:  ?   Effort: Pulmonary effort is normal.  ?  ?Prenatal labs: ?ABO, Rh: --/--/O POS (03/02 1808) ?Antibody: NEG (03/02 1808) ?Rubella: Immune (08/15 0000) ?RPR: Nonreactive (08/15 0000)  ?HBsAg: Negative (08/15 0000)  ?HIV: Non-reactive (08/15 0000)  ?GBS: NEGATIVE/-- (03/02 1825)  ? ?Assessment/Plan: ?36 yo G2P1 @ 37 2/7 wks ?CHTN ?A2GDM ?For two stage IOL  ? ?20 II ?06/22/2021, 1:26 PM ? ? ? ? ?

## 2021-06-25 ENCOUNTER — Inpatient Hospital Stay (HOSPITAL_COMMUNITY): Payer: BC Managed Care – PPO

## 2021-06-25 ENCOUNTER — Other Ambulatory Visit: Payer: Self-pay

## 2021-06-25 ENCOUNTER — Encounter (HOSPITAL_COMMUNITY)
Admission: AD | Disposition: A | Discharge status: Home / Self Care | Payer: Self-pay | Source: Home / Self Care | Attending: Obstetrics and Gynecology

## 2021-06-25 ENCOUNTER — Encounter (HOSPITAL_COMMUNITY): Payer: Self-pay | Admitting: Obstetrics and Gynecology

## 2021-06-25 ENCOUNTER — Inpatient Hospital Stay (HOSPITAL_COMMUNITY)
Admission: AD | Admit: 2021-06-25 | Discharge: 2021-06-27 | DRG: 788 | Disposition: A | Discharge status: Home / Self Care | Payer: BC Managed Care – PPO | Attending: Obstetrics and Gynecology | Admitting: Obstetrics and Gynecology

## 2021-06-25 DIAGNOSIS — O24425 Gestational diabetes mellitus in childbirth, controlled by oral hypoglycemic drugs: Secondary | ICD-10-CM | POA: Diagnosis present

## 2021-06-25 DIAGNOSIS — Z20822 Contact with and (suspected) exposure to covid-19: Secondary | ICD-10-CM | POA: Diagnosis present

## 2021-06-25 DIAGNOSIS — O10919 Unspecified pre-existing hypertension complicating pregnancy, unspecified trimester: Secondary | ICD-10-CM | POA: Diagnosis present

## 2021-06-25 DIAGNOSIS — O99214 Obesity complicating childbirth: Secondary | ICD-10-CM | POA: Diagnosis present

## 2021-06-25 DIAGNOSIS — O324XX0 Maternal care for high head at term, not applicable or unspecified: Secondary | ICD-10-CM | POA: Diagnosis present

## 2021-06-25 DIAGNOSIS — O164 Unspecified maternal hypertension, complicating childbirth: Secondary | ICD-10-CM | POA: Diagnosis not present

## 2021-06-25 DIAGNOSIS — O1002 Pre-existing essential hypertension complicating childbirth: Secondary | ICD-10-CM | POA: Diagnosis not present

## 2021-06-25 DIAGNOSIS — O24429 Gestational diabetes mellitus in childbirth, unspecified control: Secondary | ICD-10-CM | POA: Diagnosis not present

## 2021-06-25 DIAGNOSIS — Z3A37 37 weeks gestation of pregnancy: Secondary | ICD-10-CM

## 2021-06-25 LAB — COMPREHENSIVE METABOLIC PANEL
ALT: 26 U/L (ref 0–44)
AST: 22 U/L (ref 15–41)
Albumin: 2.5 g/dL — ABNORMAL LOW (ref 3.5–5.0)
Alkaline Phosphatase: 108 U/L (ref 38–126)
Anion gap: 11 (ref 5–15)
BUN: 9 mg/dL (ref 6–20)
CO2: 19 mmol/L — ABNORMAL LOW (ref 22–32)
Calcium: 9.1 mg/dL (ref 8.9–10.3)
Chloride: 107 mmol/L (ref 98–111)
Creatinine, Ser: 0.7 mg/dL (ref 0.44–1.00)
GFR, Estimated: >60 mL/min (ref >60)
Glucose, Bld: 126 mg/dL — ABNORMAL HIGH (ref 70–99)
Potassium: 3.9 mmol/L (ref 3.5–5.1)
Sodium: 137 mmol/L (ref 135–145)
Total Bilirubin: 0.4 mg/dL (ref 0.3–1.2)
Total Protein: 6.1 g/dL — ABNORMAL LOW (ref 6.5–8.1)

## 2021-06-25 LAB — GLUCOSE, CAPILLARY
Glucose-Capillary: 125 mg/dL — ABNORMAL HIGH (ref 70–99)
Glucose-Capillary: 106 mg/dL — ABNORMAL HIGH (ref 70–99)
Glucose-Capillary: 78 mg/dL (ref 70–99)

## 2021-06-25 LAB — CBC
HCT: 35.2 % — ABNORMAL LOW (ref 36.0–46.0)
Hemoglobin: 11.5 g/dL — ABNORMAL LOW (ref 12.0–15.0)
MCH: 27.1 pg (ref 26.0–34.0)
MCHC: 32.7 g/dL (ref 30.0–36.0)
MCV: 83 fL (ref 80.0–100.0)
Platelets: 152 10*3/uL (ref 150–400)
RBC: 4.24 MIL/uL (ref 3.87–5.11)
RDW: 14.9 % (ref 11.5–15.5)
WBC: 8.6 10*3/uL (ref 4.0–10.5)
nRBC: 0 % (ref 0.0–0.2)

## 2021-06-25 LAB — TYPE AND SCREEN
ABO/RH(D): O POS
Antibody Screen: NEGATIVE

## 2021-06-25 LAB — RPR: RPR Ser Ql: NONREACTIVE

## 2021-06-25 LAB — RESP PANEL BY RT-PCR (FLU A&B, COVID) ARPGX2
Influenza A by PCR: NEGATIVE
Influenza B by PCR: NEGATIVE
SARS Coronavirus 2 by RT PCR: NEGATIVE

## 2021-06-25 SURGERY — Surgical Case
Anesthesia: Spinal

## 2021-06-25 MED ORDER — STERILE WATER FOR IRRIGATION IR SOLN
Status: DC | PRN
  Administered 2021-06-25: 1000 mL

## 2021-06-25 MED ORDER — NALBUPHINE HCL 10 MG/ML IJ SOLN
INTRAMUSCULAR | Status: AC
  Filled 2021-06-25: qty 1

## 2021-06-25 MED ORDER — PRENATAL MULTIVITAMIN CH
1.0000 | ORAL_TABLET | Freq: Every day | ORAL | Status: DC
  Administered 2021-06-26 – 2021-06-27 (×2): 1 via ORAL
  Filled 2021-06-25 (×2): qty 1

## 2021-06-25 MED ORDER — OXYCODONE-ACETAMINOPHEN 5-325 MG PO TABS: 2.0000 | ORAL_TABLET | ORAL | Status: DC | PRN

## 2021-06-25 MED ORDER — ONDANSETRON HCL 4 MG/2ML IJ SOLN
INTRAMUSCULAR | Status: AC
  Filled 2021-06-25: qty 2

## 2021-06-25 MED ORDER — PHENYLEPHRINE 40 MCG/ML (10ML) SYRINGE FOR IV PUSH (FOR BLOOD PRESSURE SUPPORT)
PREFILLED_SYRINGE | INTRAVENOUS | Status: DC | PRN
  Administered 2021-06-25: 40 ug via INTRAVENOUS

## 2021-06-25 MED ORDER — NALOXONE HCL 0.4 MG/ML IJ SOLN: 0.4000 mg | INTRAMUSCULAR | Status: DC | PRN

## 2021-06-25 MED ORDER — PHENYLEPHRINE HCL-NACL 20-0.9 MG/250ML-% IV SOLN
INTRAVENOUS | Status: DC | PRN
  Administered 2021-06-25: 60 ug/min via INTRAVENOUS

## 2021-06-25 MED ORDER — OXYTOCIN BOLUS FROM INFUSION: 333.0000 mL | Freq: Once | INTRAVENOUS | Status: DC

## 2021-06-25 MED ORDER — CEFAZOLIN SODIUM-DEXTROSE 2-3 GM-%(50ML) IV SOLR
INTRAVENOUS | Status: DC | PRN
  Administered 2021-06-25: 3 g via INTRAVENOUS

## 2021-06-25 MED ORDER — BUTORPHANOL TARTRATE 1 MG/ML IJ SOLN
1.0000 mg | INTRAMUSCULAR | Status: DC | PRN
  Administered 2021-06-25: 1 mg via INTRAVENOUS
  Filled 2021-06-25: qty 1

## 2021-06-25 MED ORDER — ONDANSETRON HCL 4 MG/2ML IJ SOLN
4.0000 mg | Freq: Three times a day (TID) | INTRAMUSCULAR | Status: DC | PRN
  Administered 2021-06-26: 4 mg via INTRAVENOUS
  Filled 2021-06-25: qty 2

## 2021-06-25 MED ORDER — ONDANSETRON HCL 4 MG/2ML IJ SOLN: 4.0000 mg | Freq: Four times a day (QID) | INTRAMUSCULAR | Status: DC | PRN

## 2021-06-25 MED ORDER — NALBUPHINE HCL 10 MG/ML IJ SOLN
10.0000 mg | INTRAMUSCULAR | Status: DC | PRN
  Administered 2021-06-25: 10 mg via INTRAVENOUS

## 2021-06-25 MED ORDER — DIBUCAINE (PERIANAL) 1 % EX OINT: 1.0000 "application " | TOPICAL_OINTMENT | CUTANEOUS | Status: DC | PRN

## 2021-06-25 MED ORDER — OXYTOCIN-SODIUM CHLORIDE 30-0.9 UT/500ML-% IV SOLN
1.0000 m[IU]/min | INTRAVENOUS | Status: DC
  Administered 2021-06-25: 2 m[IU]/min via INTRAVENOUS

## 2021-06-25 MED ORDER — SIMETHICONE 80 MG PO CHEW: 80.0000 mg | CHEWABLE_TABLET | ORAL | Status: DC | PRN

## 2021-06-25 MED ORDER — OXYTOCIN-SODIUM CHLORIDE 30-0.9 UT/500ML-% IV SOLN
2.5000 [IU]/h | INTRAVENOUS | Status: DC
  Filled 2021-06-25: qty 500

## 2021-06-25 MED ORDER — COCONUT OIL OIL: 1.0000 "application " | TOPICAL_OIL | Status: DC | PRN

## 2021-06-25 MED ORDER — ACETAMINOPHEN 10 MG/ML IV SOLN
INTRAVENOUS | Status: AC
  Filled 2021-06-25: qty 100

## 2021-06-25 MED ORDER — FENTANYL CITRATE (PF) 100 MCG/2ML IJ SOLN
INTRAMUSCULAR | Status: DC | PRN
  Administered 2021-06-25: 15 ug via INTRATHECAL

## 2021-06-25 MED ORDER — PROCHLORPERAZINE EDISYLATE 10 MG/2ML IJ SOLN
10.0000 mg | Freq: Once | INTRAMUSCULAR | Status: AC | PRN
  Administered 2021-06-25: 10 mg via INTRAVENOUS
  Filled 2021-06-25: qty 2

## 2021-06-25 MED ORDER — DIPHENHYDRAMINE HCL 25 MG PO CAPS: 25.0000 mg | ORAL_CAPSULE | ORAL | Status: DC | PRN

## 2021-06-25 MED ORDER — LACTATED RINGERS IV SOLN: INTRAVENOUS | Status: DC

## 2021-06-25 MED ORDER — PHENYLEPHRINE HCL-NACL 20-0.9 MG/250ML-% IV SOLN
INTRAVENOUS | Status: AC
  Filled 2021-06-25: qty 250

## 2021-06-25 MED ORDER — TERBUTALINE SULFATE 1 MG/ML IJ SOLN: 0.2500 mg | Freq: Once | INTRAMUSCULAR | Status: DC | PRN

## 2021-06-25 MED ORDER — NALOXONE HCL 4 MG/10ML IJ SOLN
1.0000 ug/kg/h | INTRAVENOUS | Status: DC | PRN
  Filled 2021-06-25: qty 5

## 2021-06-25 MED ORDER — MORPHINE SULFATE (PF) 0.5 MG/ML IJ SOLN
INTRAMUSCULAR | Status: AC
  Filled 2021-06-25: qty 10

## 2021-06-25 MED ORDER — HYDROMORPHONE HCL 1 MG/ML IJ SOLN: 0.2000 mg | INTRAMUSCULAR | Status: DC | PRN

## 2021-06-25 MED ORDER — ZOLPIDEM TARTRATE 5 MG PO TABS: 5.0000 mg | ORAL_TABLET | Freq: Every evening | ORAL | Status: DC | PRN

## 2021-06-25 MED ORDER — LABETALOL HCL 200 MG PO TABS
400.0000 mg | ORAL_TABLET | Freq: Three times a day (TID) | ORAL | Status: DC
  Administered 2021-06-25: 400 mg via ORAL
  Filled 2021-06-25: qty 2

## 2021-06-25 MED ORDER — ONDANSETRON HCL 4 MG/2ML IJ SOLN
INTRAMUSCULAR | Status: DC | PRN
  Administered 2021-06-25: 4 mg via INTRAVENOUS

## 2021-06-25 MED ORDER — OXYTOCIN-SODIUM CHLORIDE 30-0.9 UT/500ML-% IV SOLN
INTRAVENOUS | Status: DC | PRN
  Administered 2021-06-25: 200 mL via INTRAVENOUS

## 2021-06-25 MED ORDER — SODIUM CHLORIDE 0.9% FLUSH: 3.0000 mL | INTRAVENOUS | Status: DC | PRN

## 2021-06-25 MED ORDER — TETANUS-DIPHTH-ACELL PERTUSSIS 5-2.5-18.5 LF-MCG/0.5 IM SUSY: 0.5000 mL | PREFILLED_SYRINGE | Freq: Once | INTRAMUSCULAR | Status: DC

## 2021-06-25 MED ORDER — IBUPROFEN 600 MG PO TABS
600.0000 mg | ORAL_TABLET | Freq: Four times a day (QID) | ORAL | Status: DC | PRN
Start: 2021-06-25 — End: 2021-06-27
  Administered 2021-06-26 – 2021-06-27 (×4): 600 mg via ORAL
  Filled 2021-06-25 (×4): qty 1

## 2021-06-25 MED ORDER — OXYCODONE HCL 5 MG/5ML PO SOLN: 5.0000 mg | Freq: Once | ORAL | Status: DC | PRN

## 2021-06-25 MED ORDER — OXYCODONE HCL 5 MG PO TABS: 5.0000 mg | ORAL_TABLET | Freq: Once | ORAL | Status: DC | PRN

## 2021-06-25 MED ORDER — DEXAMETHASONE SODIUM PHOSPHATE 4 MG/ML IJ SOLN
INTRAMUSCULAR | Status: DC | PRN
  Administered 2021-06-25: 4 mg via INTRAVENOUS

## 2021-06-25 MED ORDER — DEXAMETHASONE SODIUM PHOSPHATE 4 MG/ML IJ SOLN
INTRAMUSCULAR | Status: AC
  Filled 2021-06-25: qty 1

## 2021-06-25 MED ORDER — SODIUM CHLORIDE 0.9 % IR SOLN
Status: DC | PRN
  Administered 2021-06-25: 1000 mL

## 2021-06-25 MED ORDER — BUPIVACAINE IN DEXTROSE 0.75-8.25 % IT SOLN
INTRATHECAL | Status: DC | PRN
Start: 2021-06-25 — End: 2021-06-25
  Administered 2021-06-25: 1.8 mL via INTRATHECAL

## 2021-06-25 MED ORDER — ACETAMINOPHEN 500 MG PO TABS
1000.0000 mg | ORAL_TABLET | Freq: Four times a day (QID) | ORAL | Status: DC
  Administered 2021-06-25 – 2021-06-27 (×8): 1000 mg via ORAL
  Filled 2021-06-25 (×8): qty 2

## 2021-06-25 MED ORDER — SOD CITRATE-CITRIC ACID 500-334 MG/5ML PO SOLN
30.0000 mL | ORAL | Status: DC | PRN
  Filled 2021-06-25: qty 30

## 2021-06-25 MED ORDER — WITCH HAZEL-GLYCERIN EX PADS: 1.0000 "application " | MEDICATED_PAD | CUTANEOUS | Status: DC | PRN

## 2021-06-25 MED ORDER — LIDOCAINE HCL (PF) 1 % IJ SOLN: 30.0000 mL | INTRAMUSCULAR | Status: DC | PRN

## 2021-06-25 MED ORDER — KETOROLAC TROMETHAMINE 30 MG/ML IJ SOLN
30.0000 mg | Freq: Four times a day (QID) | INTRAMUSCULAR | Status: AC | PRN
  Administered 2021-06-25: 30 mg via INTRAVENOUS
  Filled 2021-06-25: qty 1

## 2021-06-25 MED ORDER — HYDROMORPHONE HCL 1 MG/ML IJ SOLN: 0.2500 mg | INTRAMUSCULAR | Status: DC | PRN

## 2021-06-25 MED ORDER — ACETAMINOPHEN 10 MG/ML IV SOLN: 1000.0000 mg | Freq: Once | INTRAVENOUS | Status: DC | PRN

## 2021-06-25 MED ORDER — SCOPOLAMINE 1 MG/3DAYS TD PT72
1.0000 | MEDICATED_PATCH | Freq: Once | TRANSDERMAL | Status: DC
  Administered 2021-06-25: 1.5 mg via TRANSDERMAL

## 2021-06-25 MED ORDER — FLEET ENEMA 7-19 GM/118ML RE ENEM: 1.0000 | ENEMA | RECTAL | Status: DC | PRN

## 2021-06-25 MED ORDER — OXYCODONE-ACETAMINOPHEN 5-325 MG PO TABS: 1.0000 | ORAL_TABLET | ORAL | Status: DC | PRN

## 2021-06-25 MED ORDER — OXYTOCIN-SODIUM CHLORIDE 30-0.9 UT/500ML-% IV SOLN
2.5000 [IU]/h | INTRAVENOUS | Status: AC
  Administered 2021-06-25: 2.5 [IU]/h via INTRAVENOUS

## 2021-06-25 MED ORDER — MISOPROSTOL 25 MCG QUARTER TABLET
25.0000 ug | ORAL_TABLET | ORAL | Status: DC | PRN
  Administered 2021-06-25 (×2): 25 ug via VAGINAL
  Filled 2021-06-25 (×2): qty 1

## 2021-06-25 MED ORDER — KETOROLAC TROMETHAMINE 30 MG/ML IJ SOLN: 30.0000 mg | Freq: Four times a day (QID) | INTRAMUSCULAR | Status: AC | PRN

## 2021-06-25 MED ORDER — OXYTOCIN-SODIUM CHLORIDE 30-0.9 UT/500ML-% IV SOLN
INTRAVENOUS | Status: AC
  Filled 2021-06-25: qty 500

## 2021-06-25 MED ORDER — SCOPOLAMINE 1 MG/3DAYS TD PT72
MEDICATED_PATCH | TRANSDERMAL | Status: AC
  Filled 2021-06-25: qty 1

## 2021-06-25 MED ORDER — OXYCODONE HCL 5 MG PO TABS
5.0000 mg | ORAL_TABLET | ORAL | Status: DC | PRN
  Administered 2021-06-27 (×2): 10 mg via ORAL
  Filled 2021-06-25 (×2): qty 2

## 2021-06-25 MED ORDER — SIMETHICONE 80 MG PO CHEW
80.0000 mg | CHEWABLE_TABLET | Freq: Three times a day (TID) | ORAL | Status: DC
  Administered 2021-06-26 – 2021-06-27 (×5): 80 mg via ORAL
  Filled 2021-06-25 (×5): qty 1

## 2021-06-25 MED ORDER — DIPHENHYDRAMINE HCL 25 MG PO CAPS: 25.0000 mg | ORAL_CAPSULE | Freq: Four times a day (QID) | ORAL | Status: DC | PRN

## 2021-06-25 MED ORDER — GLYBURIDE 5 MG PO TABS
5.0000 mg | ORAL_TABLET | Freq: Every day | ORAL | Status: DC
  Administered 2021-06-25 – 2021-06-26 (×2): 5 mg via ORAL
  Filled 2021-06-25 (×4): qty 1

## 2021-06-25 MED ORDER — MEPERIDINE HCL 25 MG/ML IJ SOLN: 6.2500 mg | INTRAMUSCULAR | Status: DC | PRN

## 2021-06-25 MED ORDER — PENICILLIN G POT IN DEXTROSE 60000 UNIT/ML IV SOLN: 3.0000 10*6.[IU] | INTRAVENOUS | Status: DC

## 2021-06-25 MED ORDER — MORPHINE SULFATE (PF) 0.5 MG/ML IJ SOLN
INTRAMUSCULAR | Status: DC | PRN
  Administered 2021-06-25: 150 ug via INTRATHECAL

## 2021-06-25 MED ORDER — PHENYLEPHRINE 40 MCG/ML (10ML) SYRINGE FOR IV PUSH (FOR BLOOD PRESSURE SUPPORT): PREFILLED_SYRINGE | INTRAVENOUS | Status: DC | PRN

## 2021-06-25 MED ORDER — SENNOSIDES-DOCUSATE SODIUM 8.6-50 MG PO TABS
2.0000 | ORAL_TABLET | Freq: Every day | ORAL | Status: DC
  Administered 2021-06-26 – 2021-06-27 (×2): 2 via ORAL
  Filled 2021-06-25 (×2): qty 2

## 2021-06-25 MED ORDER — ACETAMINOPHEN 10 MG/ML IV SOLN
INTRAVENOUS | Status: DC | PRN
  Administered 2021-06-25: 1000 mg via INTRAVENOUS

## 2021-06-25 MED ORDER — SODIUM CHLORIDE 0.9 % IV SOLN: 5.0000 10*6.[IU] | Freq: Once | INTRAVENOUS | Status: DC

## 2021-06-25 MED ORDER — LACTATED RINGERS IV SOLN: 500.0000 mL | INTRAVENOUS | Status: DC | PRN

## 2021-06-25 MED ORDER — MENTHOL 3 MG MT LOZG: 1.0000 | LOZENGE | OROMUCOSAL | Status: DC | PRN

## 2021-06-25 MED ORDER — DIPHENHYDRAMINE HCL 50 MG/ML IJ SOLN: 12.5000 mg | INTRAMUSCULAR | Status: DC | PRN

## 2021-06-25 MED ORDER — ACETAMINOPHEN 325 MG PO TABS: 650.0000 mg | ORAL_TABLET | ORAL | Status: DC | PRN

## 2021-06-25 MED ORDER — FENTANYL CITRATE (PF) 100 MCG/2ML IJ SOLN
INTRAMUSCULAR | Status: AC
  Filled 2021-06-25: qty 2

## 2021-06-25 MED ORDER — FENTANYL CITRATE (PF) 100 MCG/2ML IJ SOLN: 50.0000 ug | INTRAMUSCULAR | Status: DC | PRN

## 2021-06-25 SURGICAL SUPPLY — 43 items
ADH SKN CLS APL DERMABOND .7 (GAUZE/BANDAGES/DRESSINGS)
APL SKNCLS STERI-STRIP NONHPOA (GAUZE/BANDAGES/DRESSINGS)
BENZOIN TINCTURE PRP APPL 2/3 (GAUZE/BANDAGES/DRESSINGS) IMPLANT
CHLORAPREP W/TINT 26ML (MISCELLANEOUS) ×3 IMPLANT
CLAMP CORD UMBIL (MISCELLANEOUS) IMPLANT
CLOTH BEACON ORANGE TIMEOUT ST (SAFETY) ×3 IMPLANT
DERMABOND ADVANCED (GAUZE/BANDAGES/DRESSINGS)
DERMABOND ADVANCED .7 DNX12 (GAUZE/BANDAGES/DRESSINGS) IMPLANT
DRESSING PREVENA PLUS CUSTOM (GAUZE/BANDAGES/DRESSINGS) IMPLANT
DRSG OPSITE POSTOP 4X10 (GAUZE/BANDAGES/DRESSINGS) ×3 IMPLANT
DRSG PREVENA PLUS CUSTOM (GAUZE/BANDAGES/DRESSINGS) ×2
ELECT REM PT RETURN 9FT ADLT (ELECTROSURGICAL)
ELECTRODE REM PT RTRN 9FT ADLT (ELECTROSURGICAL) ×2 IMPLANT
EXTENDER TRAXI PANNICULUS (MISCELLANEOUS) IMPLANT
EXTRACTOR VACUUM M CUP 4 TUBE (SUCTIONS) IMPLANT
GLOVE BIO SURGEON STRL SZ7.5 (GLOVE) ×3 IMPLANT
GLOVE BIOGEL PI IND STRL 7.0 (GLOVE) ×2 IMPLANT
GLOVE BIOGEL PI INDICATOR 7.0 (GLOVE) ×1
GOWN STRL REUS W/TWL LRG LVL3 (GOWN DISPOSABLE) ×6 IMPLANT
KIT ABG SYR 3ML LUER SLIP (SYRINGE) ×3 IMPLANT
NDL HYPO 25X5/8 SAFETYGLIDE (NEEDLE) ×2 IMPLANT
NEEDLE HYPO 25X5/8 SAFETYGLIDE (NEEDLE) ×2 IMPLANT
NS IRRIG 1000ML POUR BTL (IV SOLUTION) ×3 IMPLANT
PACK C SECTION WH (CUSTOM PROCEDURE TRAY) ×3 IMPLANT
PAD OB MATERNITY 4.3X12.25 (PERSONAL CARE ITEMS) ×3 IMPLANT
PENCIL SMOKE EVAC W/HOLSTER (ELECTROSURGICAL) ×2 IMPLANT
PENCIL SMOKE EVACUATOR (MISCELLANEOUS) ×1 IMPLANT
RETRACTOR TRAXI PANNICULUS (MISCELLANEOUS) IMPLANT
SPONGE LAP 18X18 X RAY DECT (DISPOSABLE) ×1 IMPLANT
STRIP CLOSURE SKIN 1/2X4 (GAUZE/BANDAGES/DRESSINGS) IMPLANT
SUT MNCRL 0 VIOLET CTX 36 (SUTURE) ×8 IMPLANT
SUT MONOCRYL 0 CTX 36 (SUTURE) ×8
SUT PDS AB 0 CTX 60 (SUTURE) ×3 IMPLANT
SUT PLAIN 0 NONE (SUTURE) IMPLANT
SUT PLAIN 2 0 (SUTURE)
SUT PLAIN 2 0 XLH (SUTURE) IMPLANT
SUT PLAIN ABS 2-0 CT1 27XMFL (SUTURE) IMPLANT
SUT VIC AB 4-0 KS 27 (SUTURE) ×3 IMPLANT
TOWEL OR 17X24 6PK STRL BLUE (TOWEL DISPOSABLE) ×3 IMPLANT
TRAXI PANNICULUS EXTENDER (MISCELLANEOUS) ×1
TRAXI PANNICULUS RETRACTOR (MISCELLANEOUS) ×1
TRAY FOLEY W/BAG SLVR 14FR LF (SET/KITS/TRAYS/PACK) ×3 IMPLANT
WATER STERILE IRR 1000ML POUR (IV SOLUTION) ×3 IMPLANT

## 2021-06-25 NOTE — Progress Notes (Addendum)
FHT cat one ?UCs irregular, mild ?Cytotec #2 about 5am ?Cx 1/th/ballotable per nurse check ? ?U/S 06/21/21 > EFW 8# 9oz (>97%) ? ?C/O mild HA only with UCs, none right now. No vision changes ? ?Today's Vitals  ? 06/25/21 0232 06/25/21 0300 06/25/21 0447 06/25/21 0603  ?BP: 136/68  (!) 148/98 (!) 146/78  ?Pulse: 86  86 91  ?Resp: 19     ?Temp:      ?TempSrc:      ?Weight:      ?Height:      ?PainSc:  2   5   ? ?Body mass index is 60.21 kg/m?.  ? ?Results for orders placed or performed during the hospital encounter of 06/25/21 (from the past 24 hour(s))  ?CBC     Status: Abnormal  ? Collection Time: 06/25/21 12:07 AM  ?Result Value Ref Range  ? WBC 8.6 4.0 - 10.5 K/uL  ? RBC 4.24 3.87 - 5.11 MIL/uL  ? Hemoglobin 11.5 (L) 12.0 - 15.0 g/dL  ? HCT 35.2 (L) 36.0 - 46.0 %  ? MCV 83.0 80.0 - 100.0 fL  ? MCH 27.1 26.0 - 34.0 pg  ? MCHC 32.7 30.0 - 36.0 g/dL  ? RDW 14.9 11.5 - 15.5 %  ? Platelets 152 150 - 400 K/uL  ? nRBC 0.0 0.0 - 0.2 %  ?RPR     Status: None  ? Collection Time: 06/25/21 12:07 AM  ?Result Value Ref Range  ? RPR Ser Ql NON REACTIVE NON REACTIVE  ?Comprehensive metabolic panel     Status: Abnormal  ? Collection Time: 06/25/21 12:07 AM  ?Result Value Ref Range  ? Sodium 137 135 - 145 mmol/L  ? Potassium 3.9 3.5 - 5.1 mmol/L  ? Chloride 107 98 - 111 mmol/L  ? CO2 19 (L) 22 - 32 mmol/L  ? Glucose, Bld 126 (H) 70 - 99 mg/dL  ? BUN 9 6 - 20 mg/dL  ? Creatinine, Ser 0.70 0.44 - 1.00 mg/dL  ? Calcium 9.1 8.9 - 10.3 mg/dL  ? Total Protein 6.1 (L) 6.5 - 8.1 g/dL  ? Albumin 2.5 (L) 3.5 - 5.0 g/dL  ? AST 22 15 - 41 U/L  ? ALT 26 0 - 44 U/L  ? Alkaline Phosphatase 108 38 - 126 U/L  ? Total Bilirubin 0.4 0.3 - 1.2 mg/dL  ? GFR, Estimated >60 >60 mL/min  ? Anion gap 11 5 - 15  ?Type and screen [place]     Status: None  ? Collection Time: 06/25/21 12:40 AM  ?Result Value Ref Range  ? ABO/RH(D) O POS   ? Antibody Screen NEG   ? Sample Expiration    ?  06/28/2021,2359 ?Performed at [facility],  [place] [address], [place], [place] [address] ?  ?Resp Panel by RT-PCR (Flu A&B, Covid) Nasopharyngeal Swab     Status: None  ? Collection Time: 06/25/21  1:23 AM  ? Specimen: Nasopharyngeal Swab; Nasopharyngeal(NP) swabs in vial transport medium  ?Result Value Ref Range  ? SARS Coronavirus 2 by RT PCR NEGATIVE NEGATIVE  ? Influenza A by PCR NEGATIVE NEGATIVE  ? Influenza B by PCR NEGATIVE NEGATIVE  ?Glucose, capillary     Status: Abnormal  ? Collection Time: 06/25/21  1:33 AM  ?Result Value Ref Range  ? Glucose-Capillary 125 (H) 70 - 99 mg/dL  ?Glucose, capillary     Status: Abnormal  ? Collection Time: 06/25/21  5:30 AM  ?Result Value Ref Range  ?  Glucose-Capillary 106 (H) 70 - 99 mg/dL  ?  ? ?A/P: HTN-labs OK ?               -labetalol 400mg  TID ?               -treat severe range BP per protocol ? ?        A2GDM- glyburide 5mg  this am ? ?        D/W patient EFW of approximately 9#. She has a Hx of VE for 6# 12oz baby #1. D/W risks of arrest of labor and shoulder dystocia. She states she understands. Will follow labor curve closely. ? ?

## 2021-06-25 NOTE — Progress Notes (Signed)
BSUS> VTX ?Cx 1/very soft/-4 ?FHT cat one ?UCs irregular ? ?A/P: D/W patient>pitocin. She states she understands and agrees. ?

## 2021-06-25 NOTE — Addendum Note (Signed)
Addendum  created 06/25/21 1827 by Brennan Bailey, MD  ? Order list changed, Pharmacy for encounter modified  ?  ?

## 2021-06-25 NOTE — Op Note (Signed)
Julia [name], [name]. ?MEDICAL RECORD NO: [id number] ?ACCOUNT NO: [id number] ?DATE OF BIRTH: 1985/10/29 ?FACILITY: [facility] ?LOCATION: [facility] ?PHYSICIAN: Guy Sandifer. Arleta Creek, MD ? ?Operative Report  ? ?DATE OF PROCEDURE: 06/25/2021 ? ?PREOPERATIVE DIAGNOSIS: Failed induction of labor. ? ?POSTOPERATIVE DIAGNOSIS: Failed induction of labor.  ? ?PROCEDURE:  Primary low transverse cesarean section. ? ?ASSISTANT:  Harold Hedge, M.D. ? ?ANESTHESIA:  Spinal. ? ?ESTIMATED BLOOD LOSS:  506 mL. ? ?SPECIMENS:  Placenta to pathology. ? ?FINDINGS:  Viable female infant, Apgars, arterial cord pH, birth weight pending. ? ?INDICATIONS AND CONSENT:  This patient is a 36 year old G2, P1, at 37-2/7th weeks.  Prenatal care has been complicated by hypertension with increasingly labile blood pressures, currently on labetalol 400 mg t.i.d.  Also, gestational diabetes, on  ?glyburide 5 mg b.i.d., and obesity at 329 pounds.  Ultrasound in the office on 06/21/2021 noted an estimated fetal weight of 8 pounds 9 ounces, which was greater than 97th percentile and vertex.  She is admitted for 2-stage induction of labor.  She did  ?undergo Cytotec twice.  On Pitocin, she is on 18 milliunits.  Contractions continued to be irregular.  Cervix is 1 and soft with a very high station out of the pelvis.  Bedside ultrasound confirms vertex.  After no change all day long and discussion of  ?options, the patient requests cesarean section.  The potential risks and complications were discussed preoperatively including but not limited to infection, organ damage, bleeding requiring transfusion of blood products with HIV and hepatitis  ?acquisition, DVT, PE, pneumonia, wound breakdown.  She states she understands and agrees and consent is signed on the chart. ? ?DESCRIPTION OF PROCEDURE:  The patient was taken to the operating room where she was identified.  Spinal anesthetic was placed per anesthesiologist and she was placed in the dorsal supine position with a  15-degree left lateral wedge.  Traxi was placed to ? elevate the panniculus.  Vagina was prepped with Betadine.  Foley catheter was placed and the abdomen was prepped with ChloraPrep.  Timeout was done.  After 3-minute drying time, she was draped in sterile fashion and after testing for adequate spinal  ?anesthesia, skin was entered through a Pfannenstiel incision and dissection was carried out in layers to the peritoneum.  Peritoneum was taken down superiorly and inferiorly.  Vesicouterine peritoneum was dissected bilaterally.  The flap was developed  ?and the bladder blade was placed.  Uterus was incised in a low transverse manner and the uterine cavity was entered with a hemostat.  Clear fluid was noted.  The uterine incision was extended bilaterally with fingers.  Baby was then delivered from the  ?vertex position without difficulty.  After 1 minute time, the cord was clamped and cut and the baby was handed to waiting pediatrics team.  Placenta is manually delivered and sent to pathology.  Careful inspection reveals the cavity to be clean.  Uterus  ?was closed in 2 running locking imbricating layers of 0 Monocryl suture, which achieved good hemostasis.  Lavage was carried out and all returned as clear.  Anterior peritoneum was closed in a running fashion with 0 Monocryl suture, which was also used  ?to reapproximate the pyramidalis muscle in the midline.  Anterior rectus fascia was closed in a running fashion with a 0 looped PDS, taking generous wide bites.  Subcutaneous layer was closed with interrupted plain and the skin was closed in a  ?subcuticular fashion with 4-0 Vicryl on a Keith needle.  A negative pressure  dressing is applied.  All counts were correct.  The patient was taken to recovery room in stable condition. ? ? ?[facility] ?D: 06/25/2021 4:34:20 pm T: 06/25/2021 10:36:00 pm  ?JOB: [id number]/ [id number]  ?

## 2021-06-25 NOTE — Anesthesia Preprocedure Evaluation (Signed)
Anesthesia Evaluation  ?Patient identified by MRN, date of birth, ID band ?Patient awake ? ? ? ?Reviewed: ?Allergy & Precautions, H&P , NPO status , Patient's Chart, lab work & pertinent test results, reviewed documented beta blocker date and time  ? ?History of Anesthesia Complications ?Negative for: history of anesthetic complications ? ?Airway ?Mallampati: II ? ?TM Distance: >3 FB ? ? ? ? Dental ?  ?Pulmonary ?neg pulmonary ROS,  ?  ?Pulmonary exam normal ? ? ? ? ? ? ? Cardiovascular ?hypertension, On Home Beta Blockers and On Medications ? ?Rhythm:regular Rate:Normal ? ? ?  ?Neuro/Psych ?negative neurological ROS ? negative psych ROS  ? GI/Hepatic ?negative GI ROS, Neg liver ROS,   ?Endo/Other  ?diabetes, GestationalMorbid obesity ? Renal/GU ?negative Renal ROS  ?negative genitourinary ?  ?Musculoskeletal ? ? Abdominal ?  ?Peds ? Hematology ?negative hematology ROS ?(+)   ?     Component                Value               Date/Time            ?     HGB                      11.5 (L)            06/25/2021 0007      ?     HCT                      35.2 (L)            06/25/2021 0007      ?     PLT                      152                 06/25/2021 0007        ?Anesthesia Other Findings ? ? Reproductive/Obstetrics ?(+) Pregnancy ?G2P1001 at [redacted]w[redacted]d ? ?  ? ? ? ? ? ? ? ? ? ? ? ? ? ?  ?  ? ? ? ? ? ? ? ? ?Anesthesia Physical ?Anesthesia Plan ? ?ASA: 2 ? ?Anesthesia Plan: Spinal  ? ?Post-op Pain Management:   ? ?Induction:  ? ?PONV Risk Score and Plan: Ondansetron and Treatment may vary due to age or medical condition ? ?Airway Management Planned:  ? ?Additional Equipment:  ? ?Intra-op Plan:  ? ?Post-operative Plan:  ? ?Informed Consent: I have reviewed the patients History and Physical, chart, labs and discussed the procedure including the risks, benefits and alternatives for the proposed anesthesia with the patient or authorized representative who has indicated his/her understanding and  acceptance.  ? ? ? ? ? ?Plan Discussed with: Anesthesiologist ? ?Anesthesia Plan Comments:   ? ? ? ? ? ? ?Anesthesia Quick Evaluation ? ?

## 2021-06-25 NOTE — Anesthesia Procedure Notes (Signed)
Spinal ? ?Patient location during procedure: OR ?Reason for block: surgical anesthesia ?Staffing ?Performed: anesthesiologist  ?Anesthesiologist: Lucretia Kern, MD ?Preanesthetic Checklist ?Completed: patient identified, IV checked, risks and benefits discussed, surgical consent, monitors and equipment checked, pre-op evaluation and timeout performed ?Spinal Block ?Patient position: sitting ?Prep: DuraPrep and site prepped and draped ?Patient monitoring: continuous pulse ox, blood pressure and heart rate ?Approach: midline ?Location: L3-4 ?Injection technique: single-shot ?Needle ?Needle type: Pencan  ?Needle gauge: 24 G ?Needle length: 10 cm ?Assessment ?Events: CSF return ?Additional Notes ?Functioning IV was confirmed and monitors were applied. Sterile prep and drape, including hand hygiene and sterile gloves were used. The patient was positioned and the spine was prepped. The skin was anesthetized with lidocaine. Easy spinal placement with single pass of 9 cm needle. Free flow of clear CSF was obtained prior to injecting local anesthetic into the CSF. The needle was carefully withdrawn. The patient tolerated the procedure well.  ? ? ? ?

## 2021-06-25 NOTE — Brief Op Note (Signed)
06/25/2021 ? ?4:27 PM ? ?PATIENT:  Julia [name]  36 y.o. female ? ?PRE-OPERATIVE DIAGNOSIS: Failed induction of labor ? ?POST-OPERATIVE DIAGNOSIS:  Failed induction of labor  ? ?PROCEDURE:  Procedure(s): ?CESAREAN SECTION (N/A) ? ?SURGEON:  Surgeon(s) and Role: ?   Everlene Farrier, MD - Primary ? ?PHYSICIAN ASSISTANT:  ? ?ASSISTANTS: none  ? ?ANESTHESIA:   spinal ? ?EBL:  506 ml  ? ?BLOOD ADMINISTERED:none ? ?DRAINS: Urinary Catheter (Foley)  ? ?LOCAL MEDICATIONS USED:  NONE ? ?SPECIMEN:  Source of Specimen:  placenta ? ?DISPOSITION OF SPECIMEN:  PATHOLOGY ? ?COUNTS:  YES ? ?TOURNIQUET:  * No tourniquets in log * ? ?DICTATION: .Other Dictation: Dictation Number [id number] ? ?PLAN OF CARE: Admit to inpatient  ? ?PATIENT DISPOSITION:  PACU - hemodynamically stable. ?  ?Delay start of Pharmacological VTE agent (>24hrs) due to surgical blood loss or risk of bleeding: not applicable ? ?

## 2021-06-25 NOTE — Transfer of Care (Signed)
Immediate Anesthesia Transfer of Care Note ? ?Patient: Julia [name] ? ?Procedure(s) Performed: CESAREAN SECTION ? ?Patient Location: PACU ? ?Anesthesia Type:Spinal ? ?Level of Consciousness: awake ? ?Airway & Oxygen Therapy: Patient Spontanous Breathing ? ?Post-op Assessment: Report given to RN ? ?Post vital signs: Reviewed and stable ? ?Last Vitals:  ?Vitals Value Taken Time  ?BP 124/68 06/25/21 1655  ?Temp    ?Pulse 90 06/25/21 1658  ?Resp 18 06/25/21 1658  ?SpO2 95 % 06/25/21 1658  ?Vitals shown include unvalidated device data. ? ?Last Pain:  ?Vitals:  ? 06/25/21 1230  ?TempSrc:   ?PainSc: Asleep  ?   ? ?  ? ?Complications: No notable events documented. ?

## 2021-06-25 NOTE — Progress Notes (Signed)
FHT cat one ?UCs irregular ?Pitocin @ 18 ?Cx no change-vtx out of pelvis ? ?D/W patient and husband options-continue pitocin to max dose. If no change could rest and repeat two stage IOL over night. Patient requests C/S in view of EFW of 9+#, Hx of VE for baby <7#, high presenting part and no change. ?D/W procedure and risks including infection, organ damage, bleeding/transfusion-HIV/Hep, DVT/PE, pneumonia, wound breakdown. She states she understands and agrees.  ?

## 2021-06-25 NOTE — Anesthesia Postprocedure Evaluation (Signed)
Anesthesia Post Note ? ?Patient: Julia [name] ? ?Procedure(s) Performed: CESAREAN SECTION ? ?  ? ?Patient location during evaluation: PACU ?Anesthesia Type: Spinal ?Level of consciousness: oriented and awake and alert ?Pain management: pain level controlled ?Vital Signs Assessment: post-procedure vital signs reviewed and stable ?Respiratory status: spontaneous breathing, respiratory function stable and nonlabored ventilation ?Cardiovascular status: blood pressure returned to baseline and stable ?Postop Assessment: no headache, no backache, no apparent nausea or vomiting and spinal receding ?Anesthetic complications: no ? ? ?No notable events documented. ? ?Last Vitals:  ?Vitals:  ? 06/25/21 1432 06/25/21 1656  ?BP: 134/71 124/68  ?Pulse: 79 85  ?Resp:  (!) 24  ?Temp:    ?SpO2:  98%  ?  ?Last Pain:  ?Vitals:  ? 06/25/21 1656  ?TempSrc:   ?PainSc: 0-No pain  ? ?Pain Goal:   ? ?  ?  ?  ?  ?  ?  ?  ? ?Lucretia Kern ? ? ? ? ?

## 2021-06-26 ENCOUNTER — Encounter (HOSPITAL_COMMUNITY): Payer: Self-pay | Admitting: Obstetrics and Gynecology

## 2021-06-26 LAB — CBC
HCT: 31 % — ABNORMAL LOW (ref 36.0–46.0)
Hemoglobin: 10.3 g/dL — ABNORMAL LOW (ref 12.0–15.0)
MCH: 27.5 pg (ref 26.0–34.0)
MCHC: 33.2 g/dL (ref 30.0–36.0)
MCV: 82.7 fL (ref 80.0–100.0)
Platelets: 137 10*3/uL — ABNORMAL LOW (ref 150–400)
RBC: 3.75 MIL/uL — ABNORMAL LOW (ref 3.87–5.11)
RDW: 14.8 % (ref 11.5–15.5)
WBC: 8.7 10*3/uL (ref 4.0–10.5)
nRBC: 0 % (ref 0.0–0.2)

## 2021-06-26 LAB — GLUCOSE, CAPILLARY: Glucose-Capillary: 101 mg/dL — ABNORMAL HIGH (ref 70–99)

## 2021-06-26 NOTE — Progress Notes (Addendum)
Subjective: ?Postpartum Day 1: Cesarean Delivery ?Patient reports tolerating PO and no problems voiding.  No HA, vision change, RUQ pain, CP/SOB.  Desires circ. ? ?Objective: ?Vital signs in last 24 hours: ?Temp:  [97.9 ?F (36.6 ?C)-98.1 ?F (36.7 ?C)] 98 ?F (36.7 ?C) (03/13 2220) ?Pulse Rate:  [66-90] 74 (03/14 0136) ?Resp:  [15-25] 16 (03/13 2220) ?BP: (108-138)/(59-85) 130/79 (03/14 0136) ?SpO2:  [92 %-99 %] 97 % (03/13 2220) ? ?Physical Exam:  ?General: alert, cooperative, and appears stated age ?Lochia: appropriate ?Uterine Fundus: firm ?Incision: wound vac in place ?DVT Evaluation: No evidence of DVT seen on physical exam. ?Negative Homan's sign. ?No cords or calf tenderness. ? ?Recent Labs  ?  06/25/21 ?0007 06/26/21 ?0446  ?HGB 11.5* 10.3*  ?HCT 35.2* 31.0*  ? ? ?Assessment/Plan: ?Status post Cesarean section. Doing well postoperatively.  ?Continue current care. ?CHTN-labetalol 400 TID.  Continue to monitor for medication adjustment ?Circ-patient is counseled re: risk of bleeding, infection, scarring.  All questions were answered and we will proceed. ? ?Julia [name] ?06/26/2021, 9:29 AM ? ? ?

## 2021-06-26 NOTE — Op Note (Deleted)
  The note originally documented on this encounter has been moved the the encounter in which it belongs.  

## 2021-06-26 NOTE — Lactation Note (Signed)
This note was copied from a baby's chart. ?Lactation Consultation Note ? ?Patient Name: Julia [name] ?Today's Date: 06/26/2021 ?Reason for consult: Initial assessment;Early term 37-38.6wks;Infant weight loss;Other (Comment) (attempted to see mom, MBU RN into assess mom and baby. will F/U . baby sleeping and per mom the last baby attempted to feed was at 6 am. Per mom w/ 1st baby DL and ended up pumping and bottle feeding 16 weeks.) ?Age:35 hours ? ?Maternal Data ?  ? ?Feeding ?Mother's Current Feeding Choice: Breast Milk ? ?LATCH Score ? ?Lactation Tools Discussed/Used ?  ? ?Interventions ?  ? ?Discharge ?  ? ?Consult Status ?Consult Status: Follow-up ?Date: 06/26/21 ?Follow-up type: In-patient ? ? ? ?Julia [name] ?06/26/2021, 8:24 AM ? ? ? ?

## 2021-06-26 NOTE — Lactation Note (Signed)
This note was copied from a baby's chart. ?Lactation Consultation Note ? ?Patient Name: Julia [name] ?Today's Date: 06/26/2021 ?Reason for consult: Follow-up assessment;Early term 37-38.6wks;Infant weight loss;Breastfeeding assistance;Other (Comment) (2 % weight loss, LC offered to assist to latch and mom receptive . baby latched with depth , multiple swallows / increased with compressions/ per mom comfortable) ?Age:35 hours ?Baby fed for 35 mins with depth majority of feeding until the last 5 mins when baby released and depth decreased. LC noted some areola edema which will require some reverse pressure exercise after hand express and a dab of coconut oil to soften the areola . LC provided shells while awake for between feedings except when sleeping.  ?LC plan :  ?Breast shells between feedings while awake until areola more compressible ?Mom requested DEBP set up.  ?LC recommended to feed with feeding cues and by 3 hours STS.  ?Breast feeding goals in 24 hours ( 8-12 times ) with feeding cues  ?Post pump both breast for 15 mins / save milk with #24 F / mom aware if needed to increase to #27 F .  ? ? ?Maternal Data ?Has patient been taught Hand Expression?: Yes ?Does the patient have breastfeeding experience prior to this delivery?: Yes ?How long did the patient breastfeed?: per mom 1st baby latched in the hospital and when D/C difficult and ended up pumping 16 weeks / and switched due to work ? ?Feeding ?Mother's Current Feeding Choice: Breast Milk ? ?LATCH Score ?Latch: Grasps breast easily, tongue down, lips flanged, rhythmical sucking. ? ?Audible Swallowing: Spontaneous and intermittent ? ?Type of Nipple: Everted at rest and after stimulation ? ?Comfort (Breast/Nipple): Soft / non-tender ? ?Hold (Positioning): Assistance needed to correctly position infant at breast and maintain latch. ? ?LATCH Score: 9 ? ? ?Lactation Tools Discussed/Used ?Tools: Pump;Shells ?Breast pump type: Double-Electric Breast  Pump ?Pump Education: Setup, frequency, and cleaning;Milk Storage ?Reason for Pumping: mom requesting - per mom may end up pumping and bottle feeding , for now willing to latch ? ?Interventions ?Interventions: Breast feeding basics reviewed;Assisted with latch;Skin to skin;Breast massage;Hand express;Reverse pressure;Breast compression;Adjust position;Support pillows;Position options;DEBP;Education;LC Services brochure ? ?Discharge ?Pump: Personal;DEBP ?WIC Program: No ? ?Consult Status ?Consult Status: Follow-up ?Date: 06/27/21 ?Follow-up type: In-patient ? ? ? ?Julia [name] ?06/26/2021, 10:46 AM ? ? ? ?

## 2021-06-26 NOTE — Lactation Note (Signed)
This note was copied from a baby's chart. ?Lactation Consultation Note ? ?Patient Name: Julia [name] ?Today's Date: 06/26/2021 ?Reason for consult: Follow-up assessment;Other (Comment) (2nd attempt to see mom and baby/ the social worker in the room/ will F/U) ?Age:35 hours ? ?Maternal Data ?  ? ?Feeding ?Mother's Current Feeding Choice: Breast Milk ? ?LATCH Score ?Lactation Tools Discussed/Used ?  ? ?Interventions ?  ? ?Discharge ?  ? ?Consult Status ?Consult Status: Follow-up ?Date: 06/26/21 ?Follow-up type: In-patient ? ? ? ?Matilde Sprang [name] ?06/26/2021, 9:07 AM ? ? ? ?

## 2021-06-26 NOTE — Social Work (Signed)
CSW received consult for hx of Anxiety and Postpartum Depression.  CSW met with MOB to offer support and complete assessment.    ? ?CSW introduced self and role. CSW observed MOB performing skin to skin with infant 'Tyler'. CSW informed MOB of the reason for consult. MOB was very pleasant as she shared she is doing good and had a good pregnancy. MOB disclosed that she experienced PPD around six months following the birth of her daughter Charlotte. MOB expressed initially the symptoms were not too terrible, however once she became pregnant with Tyler, it really hit. MOB shared that she had low energy and did not want to do anything. MOB stated she was prescribed Zoloft 25mg and started feeling back to herself. MOB reported she has not taken the Zoloft in 2 months but she does have access to the prescription. MOB stated she has never been to counseling, but she she is open to it if necessary. MOB identified FOB, maternal grandparents and her niece as supports. MOB denies any current SI, HI or DV.  ? ?CSW provided education regarding the baby blues period versus PPD and provided resources. CSW provided the New Mom Checklist and encouraged MOB contact a medical professional if symptoms are noted at any time.  ?CSW provided review of Sudden Infant Death Syndrome (SIDS) precautions. MOB stated infant has a crib and all essentials. MOB identified [facility] for follow-up and denies transportation barriers. MOB reported she has no additional needs at this time.    ? ?CSW identifies no further need for intervention and no barriers to discharge at this time. ? ?Chaney Johnson, LCSW ?Clinical Social Work ?[facility] ?[phone]  ?

## 2021-06-27 ENCOUNTER — Encounter (HOSPITAL_COMMUNITY): Payer: Self-pay | Admitting: Obstetrics and Gynecology

## 2021-06-27 LAB — GLUCOSE, CAPILLARY: Glucose-Capillary: 83 mg/dL (ref 70–99)

## 2021-06-27 LAB — SURGICAL PATHOLOGY

## 2021-06-27 MED ORDER — OXYCODONE HCL 5 MG PO TABS: 5.0000 mg | ORAL_TABLET | ORAL | 0 refills | Status: AC | PRN

## 2021-06-27 MED ORDER — IBUPROFEN 600 MG PO TABS
600.0000 mg | ORAL_TABLET | Freq: Four times a day (QID) | ORAL | 0 refills | Status: AC | PRN
Start: 2021-06-27 — End: ?

## 2021-06-27 MED ORDER — ACETAMINOPHEN 325 MG PO TABS: 650.0000 mg | ORAL_TABLET | Freq: Four times a day (QID) | ORAL | 0 refills | Status: AC | PRN

## 2021-06-27 NOTE — Progress Notes (Signed)
Call placed to Dr Mardelle Matte, FBS for last 2 mornings 101 and 83 this am reviewed. MD stated to hold am Glyburide this am and will see patient this am ?

## 2021-06-27 NOTE — Progress Notes (Addendum)
Postpartum Progress Note ? ?Postpartum Day 2 s/p primary Cesarean section. ? ?Subjective: ? ?Patient reports no overnight events.  She reports well controlled pain, but some increased pain after abulation.  She is otherwise ambulating without difficulty, voiding spontaneously, tolerating PO.  She reports Positive flatus, Negative BM.  Vaginal bleeding is minimal. ? ?Objective: ?Blood pressure (!) 141/75, pulse 89, temperature 97.7 ?F (36.5 ?C), temperature source Oral, resp. rate 18, height 5' 2.5" (1.588 m), weight (!) 151.7 kg, SpO2 99 %, unknown if currently breastfeeding. ? ?Physical Exam:  ?General: alert ?Lochia: appropriate ?Uterine Fundus: firm ?Incision: dressing in place ?DVT Evaluation: No evidence of DVT seen on physical exam. ? ?Recent Labs  ?  06/25/21 ?0007 06/26/21 ?0446  ?HGB 11.5* 10.3*  ?HCT 35.2* 31.0*  ? ? ?Assessment/Plan: ?Postpartum Day 2, s/p C-section ?Some pain increase with activity, discussed pain control and activity as tolerated. ?FBS 83 this AM, will discontinue glyburide.  ?cHTN - previously on labetalol but not continued postop.  Will continue to trend and restart if necessary ?Baby boy s/p circ ?Lactation following ?Doing well, continue routine postpartum care. Anticipate discharge tomorrow ? ? LOS: 2 days  ? ?Lyn Henri ?06/27/2021, 7:28 AM  ? ? ?

## 2021-06-27 NOTE — Progress Notes (Signed)
Patient now requesting to discharge to home, call placed to MD for orders. B/P 130/80 ?

## 2021-06-27 NOTE — Discharge Summary (Signed)
Obstetric Discharge Summary ? ?Julia [name] is a 36 y.o. 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  She was admitted to labor and delivery for her induction.  Her labor course was complicated by arrest of descent and she underwent primary C section and delivered a viable female infant on 06/25/2021.  Her postpartum course was uncomplicated and on PPD#2, she reported well controlled pain, spontaneous voiding, ambulating without difficulty, and tolerating PO.  Her blood pressure was well controlled without her labetalol, and blood sugars well controlled without glyburide. She was stable for discharge home on 06/27/2021 with plans for in-office follow up. ? ?Hemoglobin  ?Date Value Ref Range Status  ?06/26/2021 10.3 (L) 12.0 - 15.0 g/dL Final  ? ?HCT  ?Date Value Ref Range Status  ?06/26/2021 31.0 (L) 36.0 - 46.0 % Final  ? ? ?Physical Exam:  ?General: alert and moderate distress ?Lochia: appropriate ?Uterine Fundus: firm ?Incision: healing well ?DVT Evaluation: No evidence of DVT seen on physical exam. ? ?Discharge Diagnoses: Term Pregnancy-delivered ? ?Discharge Information: ?Date: 06/27/2021 ?Activity: Pelvic rest, as tolerated ?Diet: routine ?Medications: Tylenol, motrin, oxycodone ?Condition: Stable ?Instructions: Refer to practice specific booklet.  Discussed prior to discharge.  ?Discharge to: Home ? Follow-up Information   ? ? [place] Follow up.   ?Why: Please follow up for 1 week BP check and incision check. ?Contact information: ?[address] ?[place] [address] ?[phone] ? ? ?  ?  ? ?  ?  ? ?  ? ? ?Newborn Data: ?Live born female  ?Birth Weight: 8 lb 9.9 oz (3910 g) ?APGAR: 8, 8 ? ?Newborn Delivery   ?Birth date/time: 06/25/2021 15:49:00 ?Delivery type: C-Section, Low Transverse ?Trial of labor: Yes ?C-section categorization: Primary ?  ?  ? ?Home with mother. ? ?[name] ?06/27/2021,  9:46 PM ? ?

## 2021-06-29 ENCOUNTER — Inpatient Hospital Stay (HOSPITAL_COMMUNITY)
Admission: AD | Admit: 2021-06-29 | Discharge: 2021-07-01 | Disposition: A | Discharge status: Home / Self Care | Payer: BC Managed Care – PPO | Source: Home / Self Care | Attending: Obstetrics and Gynecology | Admitting: Obstetrics and Gynecology

## 2021-06-29 ENCOUNTER — Encounter (HOSPITAL_COMMUNITY): Payer: Self-pay | Admitting: Obstetrics and Gynecology

## 2021-06-29 ENCOUNTER — Other Ambulatory Visit: Payer: Self-pay

## 2021-06-29 DIAGNOSIS — O1495 Unspecified pre-eclampsia, complicating the puerperium: Principal | ICD-10-CM | POA: Diagnosis present

## 2021-06-29 HISTORY — DX: Unspecified pre-eclampsia, complicating the puerperium: O14.95

## 2021-06-29 LAB — COMPREHENSIVE METABOLIC PANEL
ALT: 42 U/L (ref 0–44)
AST: 37 U/L (ref 15–41)
Albumin: 2.7 g/dL — ABNORMAL LOW (ref 3.5–5.0)
Alkaline Phosphatase: 86 U/L (ref 38–126)
Anion gap: 8 (ref 5–15)
BUN: 6 mg/dL (ref 6–20)
CO2: 25 mmol/L (ref 22–32)
Calcium: 9.1 mg/dL (ref 8.9–10.3)
Chloride: 106 mmol/L (ref 98–111)
Creatinine, Ser: 0.72 mg/dL (ref 0.44–1.00)
GFR, Estimated: >60 mL/min (ref >60)
Glucose, Bld: 87 mg/dL (ref 70–99)
Potassium: 4.3 mmol/L (ref 3.5–5.1)
Sodium: 139 mmol/L (ref 135–145)
Total Bilirubin: 0.4 mg/dL (ref 0.3–1.2)
Total Protein: 6.3 g/dL — ABNORMAL LOW (ref 6.5–8.1)

## 2021-06-29 LAB — CBC
HCT: 36.1 % (ref 36.0–46.0)
Hemoglobin: 11.6 g/dL — ABNORMAL LOW (ref 12.0–15.0)
MCH: 26.9 pg (ref 26.0–34.0)
MCHC: 32.1 g/dL (ref 30.0–36.0)
MCV: 83.6 fL (ref 80.0–100.0)
Platelets: 227 10*3/uL (ref 150–400)
RBC: 4.32 MIL/uL (ref 3.87–5.11)
RDW: 15.3 % (ref 11.5–15.5)
WBC: 7.4 10*3/uL (ref 4.0–10.5)
nRBC: 0 % (ref 0.0–0.2)

## 2021-06-29 MED ORDER — METOCLOPRAMIDE HCL 10 MG PO TABS
10.0000 mg | ORAL_TABLET | Freq: Once | ORAL | Status: AC
  Administered 2021-06-29: 10 mg via ORAL
  Filled 2021-06-29: qty 1

## 2021-06-29 MED ORDER — PRENATAL MULTIVITAMIN CH
1.0000 | ORAL_TABLET | Freq: Every day | ORAL | Status: DC
  Administered 2021-06-30 – 2021-07-01 (×2): 1 via ORAL
  Filled 2021-06-29 (×2): qty 1

## 2021-06-29 MED ORDER — ACETAMINOPHEN 325 MG PO TABS
650.0000 mg | ORAL_TABLET | ORAL | Status: DC | PRN
  Administered 2021-06-30 – 2021-07-01 (×2): 650 mg via ORAL
  Filled 2021-06-29 (×2): qty 2

## 2021-06-29 MED ORDER — DOCUSATE SODIUM 100 MG PO CAPS
100.0000 mg | ORAL_CAPSULE | Freq: Every day | ORAL | Status: DC
  Administered 2021-07-01: 100 mg via ORAL
  Filled 2021-06-29 (×2): qty 1

## 2021-06-29 MED ORDER — MAGNESIUM SULFATE BOLUS VIA INFUSION
4.0000 g | Freq: Once | INTRAVENOUS | Status: AC
  Administered 2021-06-30: 4 g via INTRAVENOUS
  Filled 2021-06-29: qty 1000

## 2021-06-29 MED ORDER — FUROSEMIDE 20 MG PO TABS
20.0000 mg | ORAL_TABLET | Freq: Once | ORAL | Status: AC
  Administered 2021-06-29: 20 mg via ORAL
  Filled 2021-06-29: qty 1

## 2021-06-29 MED ORDER — MAGNESIUM SULFATE 40 GM/1000ML IV SOLN
2.0000 g/h | INTRAVENOUS | Status: DC
  Administered 2021-06-30: 2 g/h via INTRAVENOUS
  Filled 2021-06-29: qty 1000

## 2021-06-29 MED ORDER — HYDRALAZINE HCL 20 MG/ML IJ SOLN: 10.0000 mg | INTRAMUSCULAR | Status: DC | PRN

## 2021-06-29 MED ORDER — ZOLPIDEM TARTRATE 5 MG PO TABS: 5.0000 mg | ORAL_TABLET | Freq: Every evening | ORAL | Status: DC | PRN

## 2021-06-29 MED ORDER — LACTATED RINGERS IV SOLN: INTRAVENOUS | Status: DC

## 2021-06-29 MED ORDER — LABETALOL HCL 5 MG/ML IV SOLN
80.0000 mg | INTRAVENOUS | Status: DC | PRN
  Filled 2021-06-29: qty 16

## 2021-06-29 MED ORDER — LABETALOL HCL 5 MG/ML IV SOLN
40.0000 mg | INTRAVENOUS | Status: DC | PRN
  Filled 2021-06-29: qty 8

## 2021-06-29 MED ORDER — LABETALOL HCL 5 MG/ML IV SOLN
20.0000 mg | INTRAVENOUS | Status: DC | PRN
  Administered 2021-06-29: 20 mg via INTRAVENOUS
  Filled 2021-06-29: qty 4

## 2021-06-29 MED ORDER — CALCIUM CARBONATE ANTACID 500 MG PO CHEW: 2.0000 | CHEWABLE_TABLET | ORAL | Status: DC | PRN

## 2021-06-29 MED ORDER — CYCLOBENZAPRINE HCL 5 MG PO TABS
10.0000 mg | ORAL_TABLET | Freq: Once | ORAL | Status: AC
  Administered 2021-06-29: 10 mg via ORAL
  Filled 2021-06-29: qty 2

## 2021-06-29 MED ORDER — LISINOPRIL 10 MG PO TABS
10.0000 mg | ORAL_TABLET | Freq: Once | ORAL | Status: AC
  Administered 2021-06-29: 10 mg via ORAL
  Filled 2021-06-29: qty 1

## 2021-06-29 NOTE — H&P (Signed)
Julia [name] is an 36 y.o. female. S/P C/S 06/25/21 for failed IOL with CHTN and A2GDM. She was discharged 06/27/21 with normal range BP and no labetalol. She developed a HA this am refractory to multiple doses of Tylenol, ibuprofen and oxycodone. Also has spots in her vision and RUQ pain with deep inspiration. ?HA continues 5/10 after Flexeril/Reglan. Also given Lasix 20mg  po and lisinopril 10mg  po. ?Pertinent Gynecological History: ?Menses:  ?Bleeding: lochia ?Contraception: none ?DES exposure: denies ?Blood transfusions: none ?Sexually transmitted diseases: no past history ?Previous GYN Procedures:  ?Last mammogram: Date:  ?Last pap: Date:  ?OB History: G2, P2  ? ?Menstrual History: ?Menarche age: unknown ?No LMP recorded. ?  ? ?Past Medical History:  ?Diagnosis Date  ? Acne   ? Gestational diabetes   ? Preeclampsia in postpartum period 06/29/2021  ? Pregnancy induced hypertension   ? Vaginal Pap smear, abnormal   ? ? ?Past Surgical History:  ?Procedure Laterality Date  ? CESAREAN SECTION N/A 06/25/2021  ? Procedure: CESAREAN SECTION;  Surgeon: 07/01/2021, MD;  Location: [facility]S;  Service: Obstetrics;  Laterality: N/A;  ? CHOLECYSTECTOMY  12/2008  ? DRUG INDUCED ENDOSCOPY    ? gallstone removal    ? TONSILLECTOMY    ? ? ?Family History  ?Problem Relation Age of Onset  ? Asthma Mother   ? Hypertension Mother   ? Alcohol abuse Father   ? Alcohol abuse Maternal Grandmother   ? AAA (abdominal aortic aneurysm) Paternal Aunt   ? AAA (abdominal aortic aneurysm) Paternal Grandmother   ? ? ?Social History:  reports that she has never smoked. She has never used smokeless tobacco. She reports that she does not currently use alcohol. She reports that she does not use drugs. ? ?Allergies:  ?Allergies  ?Allergen Reactions  ? Morphine And Related   ?  Grandfather died from morphine ?States father had a severe reaction also   ? Latex Rash  ? ? ?Medications Prior to Admission  ?Medication Sig Dispense Refill Last Dose  ?  acetaminophen (TYLENOL) 325 MG tablet Take 2 tablets (650 mg total) by mouth every 6 (six) hours as needed for mild pain or headache. 30 tablet 0 06/29/2021  ? ibuprofen (ADVIL) 600 MG tablet Take 1 tablet (600 mg total) by mouth every 6 (six) hours as needed for moderate pain. 30 tablet 0 06/29/2021  ? Prenatal Vit-Fe Fumarate-FA (PRENATAL MULTIVITAMIN) TABS tablet Take 1 tablet by mouth daily at 12 noon.   06/29/2021  ? docusate sodium (COLACE) 100 MG capsule Take 100 mg by mouth daily.     ? oxyCODONE (OXY IR/ROXICODONE) 5 MG immediate release tablet Take 1 tablet (5 mg total) by mouth every 4 (four) hours as needed for moderate pain. 18 tablet 0   ? ? ?Review of Systems  ?Constitutional:  Negative for fever.  ?Eyes:  Positive for visual disturbance.  ?Gastrointestinal:  Negative for abdominal pain.  ?Neurological:  Positive for headaches.  ? ?Blood pressure (!) 161/95, pulse 87, temperature 97.7 ?F (36.5 ?C), temperature source Oral, resp. rate (!) 22, height 5' 2.5" (1.588 m), weight (!) 146.2 kg, SpO2 99 %, unknown if currently breastfeeding. ?Physical Exam ?Cardiovascular:  ?   Rate and Rhythm: Normal rate.  ?Pulmonary:  ?   Effort: Pulmonary effort is normal.  ?LE 2+ pedal edema ?Abdomen-obese, negative pressure dressing in place-clean and functioning ?Results for orders placed or performed during the hospital encounter of 06/29/21 (from the past 24 hour(s))  ?CBC  Status: Abnormal  ? Collection Time: 06/29/21  9:22 PM  ?Result Value Ref Range  ? WBC 7.4 4.0 - 10.5 K/uL  ? RBC 4.32 3.87 - 5.11 MIL/uL  ? Hemoglobin 11.6 (L) 12.0 - 15.0 g/dL  ? HCT 36.1 36.0 - 46.0 %  ? MCV 83.6 80.0 - 100.0 fL  ? MCH 26.9 26.0 - 34.0 pg  ? MCHC 32.1 30.0 - 36.0 g/dL  ? RDW 15.3 11.5 - 15.5 %  ? Platelets 227 150 - 400 K/uL  ? nRBC 0.0 0.0 - 0.2 %  ?Comprehensive metabolic panel     Status: Abnormal  ? Collection Time: 06/29/21  9:22 PM  ?Result Value Ref Range  ? Sodium 139 135 - 145 mmol/L  ? Potassium 4.3 3.5 - 5.1 mmol/L   ? Chloride 106 98 - 111 mmol/L  ? CO2 25 22 - 32 mmol/L  ? Glucose, Bld 87 70 - 99 mg/dL  ? BUN 6 6 - 20 mg/dL  ? Creatinine, Ser 0.72 0.44 - 1.00 mg/dL  ? Calcium 9.1 8.9 - 10.3 mg/dL  ? Total Protein 6.3 (L) 6.5 - 8.1 g/dL  ? Albumin 2.7 (L) 3.5 - 5.0 g/dL  ? AST 37 15 - 41 U/L  ? ALT 42 0 - 44 U/L  ? Alkaline Phosphatase 86 38 - 126 U/L  ? Total Bilirubin 0.4 0.3 - 1.2 mg/dL  ? GFR, Estimated >60 >60 mL/min  ? Anion gap 8 5 - 15  ? ? ?No results found. ? ?Assessment/Plan: ?Post partum preeclampsia ?Treat BP per labetalol protocol ?Magnesium Sulfate infusion for seizure prophylaxis ?Will assess BP and start oral labetalol as indicated after above initiated ?Labs at 5am ?D/W patient and husband above, all questions answered ? ?Roselle Locus II ?06/29/2021, 11:44 PM ? ?

## 2021-06-29 NOTE — MAU Note (Signed)
PT SAYS SHE DEL BY C/S ON 06-25-2021 FOR FTP DURING AN INDUCTION- FOR GEST HYPERTENSION ? ?WENT HOME ON WED 06-27-2021 ?TODAY HAS H/A- TOOK TYLENOL AT 1030 PM- 2 TABS  ?TOOK MOTRIN 1 TAB AT 2PM  ?530PM- TOOK 2 TYLENOL ?STILL HAS H/A - 9 ?BP AT WAS 160'S OVER 100'S  ?FEET/HANDS  SWOLLEN AND TINGLING  ?BLURRED VISION ?PAIN UNDER RIGHT BREAST ?

## 2021-06-29 NOTE — MAU Provider Note (Addendum)
?History  ?  ? ?CSN: [id number] ? ?Arrival date and time: 06/29/21 2038 ? ? None  ?  ? ?No chief complaint on file. ? ?Julia [name] is a 36 y.o. G2P2002 at 4 Days PP from a primary C/S who receives care at [facility].  She presents today for Elevated BP and Headache.  She reports having a headache all day despite Tylenol dosing twice and Motrin dosing once.  She reports her last dose of Tylenol was at 1700 and did not bring relief.  She describes her headache as constant,  located behind her eyes, and rates it a 9/10.  She reports having GHTN during pregnancy, but has no history of CHTN.  She states her blood pressures were normotensive prior to discharge and she is currently not on any antihypertensives.  She reports nausea and lightheadedness that started around 1900.  She states she took her blood pressure at home and it was elevated at 160s/100s.  She also reports left-sided foot and Leg pain that she describes as a "pins and needle" sensation.  She contributes this to the edema, but questions if it is related to her blood pressure. ? ? ?OB History as of 06/25/2021   ? ? Gravida  ?2  ? Para  ?2  ? Term  ?2  ? Preterm  ?0  ? AB  ?0  ? Living  ?2  ?  ? ? SAB  ?0  ? IAB  ?0  ? Ectopic  ?0  ? Multiple  ?0  ? Live Births  ?2  ?   ?  ?  ? ? ?Past Medical History:  ?Diagnosis Date  ? Acne   ? Gestational diabetes   ? Pregnancy induced hypertension   ? Vaginal Pap smear, abnormal   ? ? ?Past Surgical History:  ?Procedure Laterality Date  ? CESAREAN SECTION N/A 06/25/2021  ? Procedure: CESAREAN SECTION;  Surgeon: Harold Hedge, MD;  Location: [facility];  Service: Obstetrics;  Laterality: N/A;  ? CHOLECYSTECTOMY  12/2008  ? DRUG INDUCED ENDOSCOPY    ? gallstone removal    ? TONSILLECTOMY    ? ? ?Family History  ?Problem Relation Age of Onset  ? Asthma Mother   ? Hypertension Mother   ? Alcohol abuse Father   ? Alcohol abuse Maternal Grandmother   ? AAA (abdominal aortic aneurysm) Paternal Aunt   ? AAA (abdominal  aortic aneurysm) Paternal Grandmother   ? ? ?Social History  ? ?Tobacco Use  ? Smoking status: Never  ? Smokeless tobacco: Never  ?Vaping Use  ? Vaping Use: Never used  ?Substance Use Topics  ? Alcohol use: Not Currently  ?  Comment: 3 x monthly  ? Drug use: No  ? ? ?Allergies:  ?Allergies  ?Allergen Reactions  ? Morphine And Related   ?  Grandfather died from morphine ?States father had a severe reaction also   ? Latex Rash  ? ? ?Medications Prior to Admission  ?Medication Sig Dispense Refill Last Dose  ? acetaminophen (TYLENOL) 325 MG tablet Take 2 tablets (650 mg total) by mouth every 6 (six) hours as needed for mild pain or headache. 30 tablet 0 06/29/2021  ? ibuprofen (ADVIL) 600 MG tablet Take 1 tablet (600 mg total) by mouth every 6 (six) hours as needed for moderate pain. 30 tablet 0 06/29/2021  ? Prenatal Vit-Fe Fumarate-FA (PRENATAL MULTIVITAMIN) TABS tablet Take 1 tablet by mouth daily at 12 noon.   06/29/2021  ? docusate  sodium (COLACE) 100 MG capsule Take 100 mg by mouth daily.     ? oxyCODONE (OXY IR/ROXICODONE) 5 MG immediate release tablet Take 1 tablet (5 mg total) by mouth every 4 (four) hours as needed for moderate pain. 18 tablet 0   ? ? ?Review of Systems  ?Eyes:  Negative for visual disturbance.  ?Cardiovascular:  Positive for leg swelling.  ?Gastrointestinal:  Positive for nausea. Negative for abdominal pain and vomiting.  ?Genitourinary:  Positive for vaginal bleeding. Negative for difficulty urinating, dysuria and vaginal discharge.  ?Neurological:  Positive for light-headedness and headaches. Negative for dizziness.  ?Physical Exam  ? ?Blood pressure (!) 160/102, pulse 92, temperature 97.7 ?F (36.5 ?C), temperature source Oral, resp. rate (!) 22, height 5' 2.5" (1.588 m), weight (!) 146.2 kg, SpO2 99 %, unknown if currently breastfeeding. ?06/29/21 2345 90 169/107 Abnormal   ?06/29/21 2333 87 161/95 Abnormal   ?06/29/21 2301 91 173/100 Abnormal   ?06/29/21 2246 89 172/105 Abnormal   ?06/29/21  2233 95 170/106 Abnormal   ?06/29/21 2216 97 177/106 Abnormal   ?06/29/21 2202 96 168/103 Abnormal   ?06/29/21 2158 92 160/102 Abnormal   ?06/29/21 2141 97 144/93 Abnormal   ?06/29/21 2112 88 132/84  ? ? ?Physical Exam ?Vitals reviewed.  ?Constitutional:   ?   General: She is not in acute distress. ?   Appearance: Normal appearance. She is obese. She is not toxic-appearing.  ?HENT:  ?   Head: Normocephalic and atraumatic.  ?Eyes:  ?   Conjunctiva/sclera: Conjunctivae normal.  ?Cardiovascular:  ?   Rate and Rhythm: Normal rate and regular rhythm.  ?   Heart sounds: Normal heart sounds.  ?Pulmonary:  ?   Effort: Pulmonary effort is normal. No respiratory distress.  ?   Breath sounds: Normal breath sounds.  ?Abdominal:  ?   Palpations: Abdomen is soft.  ?   Tenderness: There is no abdominal tenderness.  ?Musculoskeletal:     ?   General: Normal range of motion.  ?   Cervical back: Normal range of motion.  ?   Right lower leg: 1+ Pitting Edema present.  ?   Left lower leg: 2+ Pitting Edema present.  ?   Comments: No abnormal warmth or redness of lower extremities.   ?Skin: ?   General: Skin is warm.  ?Neurological:  ?   Mental Status: She is alert and oriented to person, place, and time.  ?Psychiatric:     ?   Mood and Affect: Mood normal.     ?   Behavior: Behavior normal.     ?   Thought Content: Thought content normal.  ? ? ?MAU Course  ?Procedures ?Results for orders placed or performed during the hospital encounter of 06/29/21 (from the past 24 hour(s))  ?CBC     Status: Abnormal  ? Collection Time: 06/29/21  9:22 PM  ?Result Value Ref Range  ? WBC 7.4 4.0 - 10.5 K/uL  ? RBC 4.32 3.87 - 5.11 MIL/uL  ? Hemoglobin 11.6 (L) 12.0 - 15.0 g/dL  ? HCT 36.1 36.0 - 46.0 %  ? MCV 83.6 80.0 - 100.0 fL  ? MCH 26.9 26.0 - 34.0 pg  ? MCHC 32.1 30.0 - 36.0 g/dL  ? RDW 15.3 11.5 - 15.5 %  ? Platelets 227 150 - 400 K/uL  ? nRBC 0.0 0.0 - 0.2 %  ?Comprehensive metabolic panel     Status: Abnormal  ? Collection Time: 06/29/21  9:22  PM  ?Result Value  Ref Range  ? Sodium 139 135 - 145 mmol/L  ? Potassium 4.3 3.5 - 5.1 mmol/L  ? Chloride 106 98 - 111 mmol/L  ? CO2 25 22 - 32 mmol/L  ? Glucose, Bld 87 70 - 99 mg/dL  ? BUN 6 6 - 20 mg/dL  ? Creatinine, Ser 0.72 0.44 - 1.00 mg/dL  ? Calcium 9.1 8.9 - 10.3 mg/dL  ? Total Protein 6.3 (L) 6.5 - 8.1 g/dL  ? Albumin 2.7 (L) 3.5 - 5.0 g/dL  ? AST 37 15 - 41 U/L  ? ALT 42 0 - 44 U/L  ? Alkaline Phosphatase 86 38 - 126 U/L  ? Total Bilirubin 0.4 0.3 - 1.2 mg/dL  ? GFR, Estimated >60 >60 mL/min  ? Anion gap 8 5 - 15  ?Type and screen [facility]     Status: None  ? Collection Time: 06/29/21 11:28 PM  ?Result Value Ref Range  ? ABO/RH(D) O POS   ? Antibody Screen NEG   ? Sample Expiration    ?  07/02/2021,2359 ?Performed at [facility], [address] [place], [place] [id number] ?  ? ? ?MDM ?Physical Exam ?Labs: CBC, CMP ?Measure BPQ15 min ?Pain Management ?Start IV ?Labetalol per protocol ?Consult ?Assessment and Plan  ?36 year old, G2P2002  ?4 Days Postpartum ?GHTN ?Headache ? ?-Reviewed POC with patient. ?-Exam performed.  ?-Initial BP elevated, but not severe range. ?-Plan to treat HA with Reglan and Flexeril. ?-Plan to give dose of Lasix 20 mg now ?-Labs ordered and collected. ?-Dr. Despina HiddenEure consulted regarding patient initial assessment and complaints. Advises ?*Agrees with Lasix dosing. Recommends 20mg  BID x 5 days. ?*Considering complaints and weight, give Lisinopril 10mg  now and daily if discharged. ?-Orders placed.   ?-Monitor and reassess. ? ?Cherre RobinsJessica L Mauricia Mertens ?06/29/2021, 10:07 PM  ? ?Reassessment (11:18 PM) ?Postpartum PreEclampsia ? ?-Patient reports some improvement in HA with flexeril/reglan dosing, but not resolved. ?-Patient now reports, to nurse, onset of blurry vision, RUQ pain, and pain with deep breathing.  ?-Results return negative. BP within severe range.  ?-Orders placed for initiation of IV and Labetalol per PreE protocol. ?-Consider admission for MgSO4 infusion and  management of blood pressure.  ?-Dr. Otelia LimesL. Eure consulted and informed of patient status, evaluation, interventions, and results. Advised: ?*Agree with recommendation  ?-Dr. Myrene GalasJ. Tomblin contacted and infor

## 2021-06-30 LAB — COMPREHENSIVE METABOLIC PANEL
ALT: 39 U/L (ref 0–44)
AST: 35 U/L (ref 15–41)
Albumin: 2.3 g/dL — ABNORMAL LOW (ref 3.5–5.0)
Alkaline Phosphatase: 82 U/L (ref 38–126)
Anion gap: 10 (ref 5–15)
BUN: <5 mg/dL — ABNORMAL LOW (ref 6–20)
CO2: 21 mmol/L — ABNORMAL LOW (ref 22–32)
Calcium: 8.6 mg/dL — ABNORMAL LOW (ref 8.9–10.3)
Chloride: 107 mmol/L (ref 98–111)
Creatinine, Ser: 0.69 mg/dL (ref 0.44–1.00)
GFR, Estimated: >60 mL/min (ref >60)
Glucose, Bld: 93 mg/dL (ref 70–99)
Potassium: 3.7 mmol/L (ref 3.5–5.1)
Sodium: 138 mmol/L (ref 135–145)
Total Bilirubin: 0.3 mg/dL (ref 0.3–1.2)
Total Protein: 5.5 g/dL — ABNORMAL LOW (ref 6.5–8.1)

## 2021-06-30 LAB — CBC
HCT: 31.9 % — ABNORMAL LOW (ref 36.0–46.0)
Hemoglobin: 10.4 g/dL — ABNORMAL LOW (ref 12.0–15.0)
MCH: 27.3 pg (ref 26.0–34.0)
MCHC: 32.6 g/dL (ref 30.0–36.0)
MCV: 83.7 fL (ref 80.0–100.0)
Platelets: 204 10*3/uL (ref 150–400)
RBC: 3.81 MIL/uL — ABNORMAL LOW (ref 3.87–5.11)
RDW: 15.4 % (ref 11.5–15.5)
WBC: 6.2 10*3/uL (ref 4.0–10.5)
nRBC: 0 % (ref 0.0–0.2)

## 2021-06-30 LAB — TYPE AND SCREEN
ABO/RH(D): O POS
Antibody Screen: NEGATIVE

## 2021-06-30 LAB — MAGNESIUM: Magnesium: 3.4 mg/dL — ABNORMAL HIGH (ref 1.7–2.4)

## 2021-06-30 MED ORDER — FUROSEMIDE 10 MG/ML IJ SOLN
20.0000 mg | Freq: Once | INTRAMUSCULAR | Status: AC
  Administered 2021-06-30: 20 mg via INTRAVENOUS
  Filled 2021-06-30: qty 2

## 2021-06-30 MED ORDER — CYCLOBENZAPRINE HCL 10 MG PO TABS
10.0000 mg | ORAL_TABLET | Freq: Three times a day (TID) | ORAL | Status: DC | PRN
  Administered 2021-07-01: 10 mg via ORAL
  Filled 2021-06-30: qty 1

## 2021-06-30 MED ORDER — LACTATED RINGERS IV BOLUS
250.0000 mL | Freq: Once | INTRAVENOUS | Status: AC
  Administered 2021-06-30: 250 mL via INTRAVENOUS

## 2021-06-30 MED ORDER — IBUPROFEN 600 MG PO TABS
600.0000 mg | ORAL_TABLET | Freq: Four times a day (QID) | ORAL | Status: DC | PRN
  Administered 2021-06-30: 600 mg via ORAL
  Filled 2021-06-30: qty 1

## 2021-06-30 MED ORDER — OXYCODONE HCL 5 MG PO TABS
5.0000 mg | ORAL_TABLET | Freq: Four times a day (QID) | ORAL | Status: DC | PRN
  Administered 2021-06-30 (×2): 5 mg via ORAL
  Filled 2021-06-30 (×2): qty 1

## 2021-06-30 MED ORDER — LABETALOL HCL 200 MG PO TABS
400.0000 mg | ORAL_TABLET | Freq: Three times a day (TID) | ORAL | Status: DC
  Administered 2021-06-30 – 2021-07-01 (×4): 400 mg via ORAL
  Filled 2021-06-30 (×4): qty 2

## 2021-06-30 MED ORDER — FUROSEMIDE 20 MG PO TABS
20.0000 mg | ORAL_TABLET | Freq: Two times a day (BID) | ORAL | Status: DC
  Administered 2021-06-30 – 2021-07-01 (×2): 20 mg via ORAL
  Filled 2021-06-30 (×3): qty 1

## 2021-06-30 NOTE — Progress Notes (Signed)
Earlier today she was given oxycodone for HA. Her BP dropped to sys 90s and she was symptomatic. BP responded to IV fluid bolus and stopping the magnesium sulfate. ?HA improved but it has waxed and waned, treated prn with oxycodone. ? ?Today's Vitals  ? 06/30/21 1927 06/30/21 1930 06/30/21 2122 06/30/21 2212  ?BP:  136/72    ?Pulse:  92    ?Resp:  17    ?Temp:  97.7 ?F (36.5 ?C)    ?TempSrc:  Oral    ?SpO2:  96%    ?Weight:      ?Height:      ?PainSc: 3   8  7    ? ?Body mass index is 58.03 kg/m?.  ? ?Patient smiling ?DTR 1+ ?LE 2+ edema below knees ? ?A/P: PP preeclampsia ?           Good response of BP to labetalol 400mg  TID ?           Magnesium is withheld due to hypotension with infusion and now about 23 hours after admission ? ?       Edema- lasix 20mg  po BID ? ?       HA- will treat prn ?

## 2021-06-30 NOTE — Progress Notes (Signed)
C/O recurrent bad HA this am unresponsive to Tylenol ?Ambulating to BR without difficulty, voiding ? ?Today's Vitals  ? 06/30/21 0400 06/30/21 0500 06/30/21 0600 06/30/21 0700  ?BP: 137/62 (!) 146/70 (!) 142/69 (!) 158/73  ?Pulse: 89 98 90 87  ?Resp: 19 19 19 17   ?Temp:      ?TempSrc:      ?SpO2: 95% 95% 95% 95%  ?Weight:      ?Height:      ?PainSc: Asleep Asleep    ? ?Body mass index is 58.03 kg/m?.  ? ?Smiling, using breast pump ?Abdomen soft, negative pressure dressing in place ?LE 3+ edema bilat ?\ ?Results for orders placed or performed during the hospital encounter of 06/29/21 (from the past 24 hour(s))  ?CBC     Status: Abnormal  ? Collection Time: 06/29/21  9:22 PM  ?Result Value Ref Range  ? WBC 7.4 4.0 - 10.5 K/uL  ? RBC 4.32 3.87 - 5.11 MIL/uL  ? Hemoglobin 11.6 (L) 12.0 - 15.0 g/dL  ? HCT 36.1 36.0 - 46.0 %  ? MCV 83.6 80.0 - 100.0 fL  ? MCH 26.9 26.0 - 34.0 pg  ? MCHC 32.1 30.0 - 36.0 g/dL  ? RDW 15.3 11.5 - 15.5 %  ? Platelets 227 150 - 400 K/uL  ? nRBC 0.0 0.0 - 0.2 %  ?Comprehensive metabolic panel     Status: Abnormal  ? Collection Time: 06/29/21  9:22 PM  ?Result Value Ref Range  ? Sodium 139 135 - 145 mmol/L  ? Potassium 4.3 3.5 - 5.1 mmol/L  ? Chloride 106 98 - 111 mmol/L  ? CO2 25 22 - 32 mmol/L  ? Glucose, Bld 87 70 - 99 mg/dL  ? BUN 6 6 - 20 mg/dL  ? Creatinine, Ser 0.72 0.44 - 1.00 mg/dL  ? Calcium 9.1 8.9 - 10.3 mg/dL  ? Total Protein 6.3 (L) 6.5 - 8.1 g/dL  ? Albumin 2.7 (L) 3.5 - 5.0 g/dL  ? AST 37 15 - 41 U/L  ? ALT 42 0 - 44 U/L  ? Alkaline Phosphatase 86 38 - 126 U/L  ? Total Bilirubin 0.4 0.3 - 1.2 mg/dL  ? GFR, Estimated >60 >60 mL/min  ? Anion gap 8 5 - 15  ?Type and screen [facility]     Status: None  ? Collection Time: 06/29/21 11:28 PM  ?Result Value Ref Range  ? ABO/RH(D) O POS   ? Antibody Screen NEG   ? Sample Expiration    ?  07/02/2021,2359 ?Performed at [facility], [address] [place], [place] ?  ?Comprehensive metabolic panel      Status: Abnormal  ? Collection Time: 06/30/21  4:06 AM  ?Result Value Ref Range  ? Sodium 138 135 - 145 mmol/L  ? Potassium 3.7 3.5 - 5.1 mmol/L  ? Chloride 107 98 - 111 mmol/L  ? CO2 21 (L) 22 - 32 mmol/L  ? Glucose, Bld 93 70 - 99 mg/dL  ? BUN <5 (L) 6 - 20 mg/dL  ? Creatinine, Ser 0.69 0.44 - 1.00 mg/dL  ? Calcium 8.6 (L) 8.9 - 10.3 mg/dL  ? Total Protein 5.5 (L) 6.5 - 8.1 g/dL  ? Albumin 2.3 (L) 3.5 - 5.0 g/dL  ? AST 35 15 - 41 U/L  ? ALT 39 0 - 44 U/L  ? Alkaline Phosphatase 82 38 - 126 U/L  ? Total Bilirubin 0.3 0.3 - 1.2 mg/dL  ? GFR, Estimated >60 >  60 mL/min  ? Anion gap 10 5 - 15  ?Magnesium     Status: Abnormal  ? Collection Time: 06/30/21  4:06 AM  ?Result Value Ref Range  ? Magnesium 3.4 (H) 1.7 - 2.4 mg/dL  ?CBC     Status: Abnormal  ? Collection Time: 06/30/21  4:06 AM  ?Result Value Ref Range  ? WBC 6.2 4.0 - 10.5 K/uL  ? RBC 3.81 (L) 3.87 - 5.11 MIL/uL  ? Hemoglobin 10.4 (L) 12.0 - 15.0 g/dL  ? HCT 31.9 (L) 36.0 - 46.0 %  ? MCV 83.7 80.0 - 100.0 fL  ? MCH 27.3 26.0 - 34.0 pg  ? MCHC 32.6 30.0 - 36.0 g/dL  ? RDW 15.4 11.5 - 15.5 %  ? Platelets 204 150 - 400 K/uL  ? nRBC 0.0 0.0 - 0.2 %  ?  ?A/P: PP preeclampsia ?           Continue magnesium sulfate ?           Next dose of labetalol due 1000>will give early ? ?        Edema-lasix ? ?         Headache Tylenol/ibuprofen/oxycodone prn ?

## 2021-06-30 NOTE — Lactation Note (Signed)
Lactation Consultation Note ? ?Patient Name: Julia [name] ?Today's Date: 06/30/2021 ?  ?Age:36 y.o. ? ? Per M. Marco Collie, RN, patient declined a lactation visit as patient told RN she had been previously fitted for her flange size.  ? ?Julia [name] ?06/30/2021, 12:19 PM ? ? ? ?

## 2021-07-01 LAB — CBC
HCT: 33 % — ABNORMAL LOW (ref 36.0–46.0)
Hemoglobin: 10.8 g/dL — ABNORMAL LOW (ref 12.0–15.0)
MCH: 27.6 pg (ref 26.0–34.0)
MCHC: 32.7 g/dL (ref 30.0–36.0)
MCV: 84.2 fL (ref 80.0–100.0)
Platelets: 226 10*3/uL (ref 150–400)
RBC: 3.92 MIL/uL (ref 3.87–5.11)
RDW: 15.5 % (ref 11.5–15.5)
WBC: 5.6 10*3/uL (ref 4.0–10.5)
nRBC: 0 % (ref 0.0–0.2)

## 2021-07-01 LAB — COMPREHENSIVE METABOLIC PANEL
ALT: 37 U/L (ref 0–44)
AST: 23 U/L (ref 15–41)
Albumin: 2.5 g/dL — ABNORMAL LOW (ref 3.5–5.0)
Alkaline Phosphatase: 83 U/L (ref 38–126)
Anion gap: 8 (ref 5–15)
BUN: 10 mg/dL (ref 6–20)
CO2: 25 mmol/L (ref 22–32)
Calcium: 8.7 mg/dL — ABNORMAL LOW (ref 8.9–10.3)
Chloride: 106 mmol/L (ref 98–111)
Creatinine, Ser: 0.87 mg/dL (ref 0.44–1.00)
GFR, Estimated: >60 mL/min (ref >60)
Glucose, Bld: 129 mg/dL — ABNORMAL HIGH (ref 70–99)
Potassium: 4.2 mmol/L (ref 3.5–5.1)
Sodium: 139 mmol/L (ref 135–145)
Total Bilirubin: 0.2 mg/dL — ABNORMAL LOW (ref 0.3–1.2)
Total Protein: 5.7 g/dL — ABNORMAL LOW (ref 6.5–8.1)

## 2021-07-01 MED ORDER — LABETALOL HCL 200 MG PO TABS: 400.0000 mg | ORAL_TABLET | Freq: Three times a day (TID) | ORAL | 0 refills | Status: AC

## 2021-07-01 MED ORDER — FUROSEMIDE 20 MG PO TABS: 20.0000 mg | ORAL_TABLET | Freq: Two times a day (BID) | ORAL | 0 refills | Status: AC

## 2021-07-01 MED ORDER — CYCLOBENZAPRINE HCL 10 MG PO TABS: 10.0000 mg | ORAL_TABLET | Freq: Three times a day (TID) | ORAL | 0 refills | Status: AC | PRN

## 2021-07-01 NOTE — Progress Notes (Signed)
HA improved but has never completely resolved. HA is over her eyes, bilateral, and if it gets worse it climbs up and over her head. When HA was bad yesterday she had "haziness" at the periphery of her vision. Today no changes in vision. HA today is 4/10 after Tylenol. ?Still uncomfortable with edema in LE. No SOB, no CP. ? ?Today's Vitals  ? 07/01/21 0405 07/01/21 0600 07/01/21 0626 07/01/21 0807  ?BP: 133/80   139/86  ?Pulse: 81   77  ?Resp: 18   17  ?Temp: 97.6 ?F (36.4 ?C)   98.2 ?F (36.8 ?C)  ?TempSrc: Oral   Oral  ?SpO2: 98%   98%  ?Weight:   (!) 142.9 kg   ?Height:      ?PainSc:  2     ? ?Body mass index is 56.7 kg/m?.  ? ?Smiling in NAD ?Face symmetric, grip strength normal and equal ?DTR 1+ ?LE edema 2+ ?Abdomen soft, negative pressure dressing in place ? ?Results for orders placed or performed during the hospital encounter of 06/29/21 (from the past 24 hour(s))  ?CBC     Status: Abnormal  ? Collection Time: 07/01/21  4:23 AM  ?Result Value Ref Range  ? WBC 5.6 4.0 - 10.5 K/uL  ? RBC 3.92 3.87 - 5.11 MIL/uL  ? Hemoglobin 10.8 (L) 12.0 - 15.0 g/dL  ? HCT 33.0 (L) 36.0 - 46.0 %  ? MCV 84.2 80.0 - 100.0 fL  ? MCH 27.6 26.0 - 34.0 pg  ? MCHC 32.7 30.0 - 36.0 g/dL  ? RDW 15.5 11.5 - 15.5 %  ? Platelets 226 150 - 400 K/uL  ? nRBC 0.0 0.0 - 0.2 %  ?Comprehensive metabolic panel     Status: Abnormal  ? Collection Time: 07/01/21  4:23 AM  ?Result Value Ref Range  ? Sodium 139 135 - 145 mmol/L  ? Potassium 4.2 3.5 - 5.1 mmol/L  ? Chloride 106 98 - 111 mmol/L  ? CO2 25 22 - 32 mmol/L  ? Glucose, Bld 129 (H) 70 - 99 mg/dL  ? BUN 10 6 - 20 mg/dL  ? Creatinine, Ser 0.87 0.44 - 1.00 mg/dL  ? Calcium 8.7 (L) 8.9 - 10.3 mg/dL  ? Total Protein 5.7 (L) 6.5 - 8.1 g/dL  ? Albumin 2.5 (L) 3.5 - 5.0 g/dL  ? AST 23 15 - 41 U/L  ? ALT 37 0 - 44 U/L  ? Alkaline Phosphatase 83 38 - 126 U/L  ? Total Bilirubin 0.2 (L) 0.3 - 1.2 mg/dL  ? GFR, Estimated >60 >60 mL/min  ? Anion gap 8 5 - 15  ?  ?A/P: PP preeclampsia ?          Good BP  control ?          Continue labetalol 400mg  TID ? ?        Edema - weight down 3.3 Kg in 24 hours ?                       Continue Lasix ? ?         HA- will give cyclobenzaprine ?                Observe  ?

## 2021-07-01 NOTE — Progress Notes (Signed)
HA is gone ? ?Today's Vitals  ? 07/01/21 0600 07/01/21 0626 07/01/21 0807 07/01/21 1237  ?BP:   139/86 135/77  ?Pulse:   77 86  ?Resp:   17 18  ?Temp:   98.2 ?F (36.8 ?C) 98 ?F (36.7 ?C)  ?TempSrc:   Oral Oral  ?SpO2:   98% 97%  ?Weight:  (!) 142.9 kg    ?Height:      ?PainSc: 2      ? ?Body mass index is 56.7 kg/m?.  ? ?A/P: discharge home ?        Labetalol 400mg  po TID ?        Lasix 20mg  po BID x 5 days ?        FU in office 3 days as scheduled ?

## 2021-07-01 NOTE — Discharge Summary (Signed)
Physician Discharge Summary  ?Patient ID: ?Julia [name] ?MRN: [id number] ?DOB/AGE: [birth year] 36 y.o. ? ?Admit date: 06/29/2021 ?Discharge date: 07/01/2021 ? ?Admission Diagnoses:preeclampsia in post partum period ? ?Discharge Diagnoses:  ?Principal Problem: ?  Preeclampsia in postpartum period ?Dependent edema ? ?Discharged Condition: good ? ?Hospital Course: Julia [name] is an 36 y.o. female. S/P C/S 06/25/21 for failed IOL with CHTN and A2GDM. She was discharged 06/27/21 with normal range BP and no labetalol. She developed a HA this am refractory to multiple doses of Tylenol, ibuprofen and oxycodone. Also has spots in her vision and RUQ pain with deep inspiration. ?HA continues 5/10 after Flexeril/Reglan. Also given Lasix 20mg  po and lisinopril 10mg  po. ?She was admitted and received IV magnesium sulfate and additional lasix 20mg  IV. The next morning she had an episode of symptomatic hypotension after oxycodone for headache. This was treated with IV fluids and the magnesium sulfate was stopped. Her blood pressure readily responded. The labetalol was continued and magnesium held. She had persistent frontal HA that responded to Tylenol/ibuprofen, oxycodone and then resolved with addition of cyclobenzaprine. BP is well controlled and labs are normal. She will continue labaetalol 400mg  po TID and lasix 20mg  po BID x 5 days. ? ?Consults: None ? ?Significant Diagnostic Studies: labs:  ?Results for orders placed or performed during the hospital encounter of 06/29/21 (from the past 72 hour(s))  ?CBC     Status: Abnormal  ? Collection Time: 06/29/21  9:22 PM  ?Result Value Ref Range  ? WBC 7.4 4.0 - 10.5 K/uL  ? RBC 4.32 3.87 - 5.11 MIL/uL  ? Hemoglobin 11.6 (L) 12.0 - 15.0 g/dL  ? HCT 36.1 36.0 - 46.0 %  ? MCV 83.6 80.0 - 100.0 fL  ? MCH 26.9 26.0 - 34.0 pg  ? MCHC 32.1 30.0 - 36.0 g/dL  ? RDW 15.3 11.5 - 15.5 %  ? Platelets 227 150 - 400 K/uL  ?  Comment: REPEATED TO VERIFY  ? nRBC 0.0 0.0 - 0.2 %  ?  Comment: Performed  at [facility], [place] [address] [place], [place] [address]  ?Comprehensive metabolic panel     Status: Abnormal  ? Collection Time: 06/29/21  9:22 PM  ?Result Value Ref Range  ? Sodium 139 135 - 145 mmol/L  ? Potassium 4.3 3.5 - 5.1 mmol/L  ? Chloride 106 98 - 111 mmol/L  ? CO2 25 22 - 32 mmol/L  ? Glucose, Bld 87 70 - 99 mg/dL  ?  Comment: Glucose reference range applies only to samples taken after fasting for at least 8 hours.  ? BUN 6 6 - 20 mg/dL  ? Creatinine, Ser 0.72 0.44 - 1.00 mg/dL  ? Calcium 9.1 8.9 - 10.3 mg/dL  ? Total Protein 6.3 (L) 6.5 - 8.1 g/dL  ? Albumin 2.7 (L) 3.5 - 5.0 g/dL  ? AST 37 15 - 41 U/L  ? ALT 42 0 - 44 U/L  ? Alkaline Phosphatase 86 38 - 126 U/L  ? Total Bilirubin 0.4 0.3 - 1.2 mg/dL  ? GFR, Estimated >60 >60 mL/min  ?  Comment: (NOTE) ?Calculated using the CKD-EPI Creatinine Equation (2021) ?  ? Anion gap 8 5 - 15  ?  Comment: Performed at [facility], [place] [address] [place], [place] [address]  ?Type and screen [place]     Status: None  ? Collection Time: 06/29/21 11:28 PM  ?Result Value Ref Range  ? ABO/RH(D) Jenetta Downer  POS   ? Antibody Screen NEG   ? Sample Expiration    ?  07/02/2021,2359 ?Performed at [facility], [place] [address], [place], [place] [address] ?  ?Comprehensive metabolic panel     Status: Abnormal  ? Collection Time: 06/30/21  4:06 AM  ?Result Value Ref Range  ? Sodium 138 135 - 145 mmol/L  ? Potassium 3.7 3.5 - 5.1 mmol/L  ? Chloride 107 98 - 111 mmol/L  ? CO2 21 (L) 22 - 32 mmol/L  ? Glucose, Bld 93 70 - 99 mg/dL  ?  Comment: Glucose reference range applies only to samples taken after fasting for at least 8 hours.  ? BUN <5 (L) 6 - 20 mg/dL  ? Creatinine, Ser 0.69 0.44 - 1.00 mg/dL  ? Calcium 8.6 (L) 8.9 - 10.3 mg/dL  ? Total Protein 5.5 (L) 6.5 - 8.1 g/dL  ? Albumin 2.3 (L) 3.5 - 5.0 g/dL  ? AST 35 15 - 41 U/L  ? ALT 39 0 - 44 U/L  ? Alkaline Phosphatase 82 38 - 126 U/L  ? Total Bilirubin 0.3 0.3 - 1.2 mg/dL  ? GFR, Estimated >60  >60 mL/min  ?  Comment: (NOTE) ?Calculated using the CKD-EPI Creatinine Equation (2021) ?  ? Anion gap 10 5 - 15  ?  Comment: Performed at [facility], [place] [address] [place], [place] [address]  ?Magnesium     Status: Abnormal  ? Collection Time: 06/30/21  4:06 AM  ?Result Value Ref Range  ? Magnesium 3.4 (H) 1.7 - 2.4 mg/dL  ?  Comment: Performed at [facility], [place] [address] [place], [place] [address]  ?CBC     Status: Abnormal  ? Collection Time: 06/30/21  4:06 AM  ?Result Value Ref Range  ? WBC 6.2 4.0 - 10.5 K/uL  ? RBC 3.81 (L) 3.87 - 5.11 MIL/uL  ? Hemoglobin 10.4 (L) 12.0 - 15.0 g/dL  ? HCT 31.9 (L) 36.0 - 46.0 %  ? MCV 83.7 80.0 - 100.0 fL  ? MCH 27.3 26.0 - 34.0 pg  ? MCHC 32.6 30.0 - 36.0 g/dL  ? RDW 15.4 11.5 - 15.5 %  ? Platelets 204 150 - 400 K/uL  ? nRBC 0.0 0.0 - 0.2 %  ?  Comment: Performed at [facility], [place] [address] [place], [place] [address]  ?CBC     Status: Abnormal  ? Collection Time: 07/01/21  4:23 AM  ?Result Value Ref Range  ? WBC 5.6 4.0 - 10.5 K/uL  ? RBC 3.92 3.87 - 5.11 MIL/uL  ? Hemoglobin 10.8 (L) 12.0 - 15.0 g/dL  ? HCT 33.0 (L) 36.0 - 46.0 %  ? MCV 84.2 80.0 - 100.0 fL  ? MCH 27.6 26.0 - 34.0 pg  ? MCHC 32.7 30.0 - 36.0 g/dL  ? RDW 15.5 11.5 - 15.5 %  ? Platelets 226 150 - 400 K/uL  ? nRBC 0.0 0.0 - 0.2 %  ?  Comment: Performed at [facility], [place] [address], [place], [place] [address]  ?Comprehensive metabolic panel     Status: Abnormal  ? Collection Time: 07/01/21  4:23 AM  ?Result Value Ref Range  ? Sodium 139 135 - 145 mmol/L  ? Potassium 4.2 3.5 - 5.1 mmol/L  ? Chloride 106 98 - 111 mmol/L  ? CO2 25 22 - 32 mmol/L  ? Glucose, Bld 129 (H) 70 - 99 mg/dL  ?  Comment: Glucose  reference range applies only to samples taken after fasting for at least 8 hours.  ? BUN 10 6 - 20 mg/dL  ? Creatinine, Ser 0.87 0.44 - 1.00 mg/dL  ? Calcium 8.7 (L) 8.9 - 10.3 mg/dL  ? Total Protein 5.7 (L) 6.5 - 8.1 g/dL  ? Albumin 2.5 (L) 3.5 - 5.0 g/dL  ? AST 23 15 - 41  U/L  ? ALT 37 0 - 44 U/L  ? Alkaline Phosphatase 83 38 - 126 U/L  ? Total Bilirubin 0.2 (L) 0.3 - 1.2 mg/dL  ? GFR, Estimated >60 >60 mL/min  ?  Comment: (NOTE) ?Calculated using the CKD-EPI Creatinine Equation (2021) ?  ? Anion gap 8 5 - 15  ?  Comment: Performed at [facility], [place] [address] [place], [place] [address]  ?  ? ?Treatments: IV hydration and magnesium sulfate ? ?Discharge Exam: ?Blood pressure 135/77, pulse 86, temperature 98 ?F (36.7 ?C), temperature source Oral, resp. rate 18, height 5' 2.5" (1.588 m), weight (!) 142.9 kg, SpO2 97 %, unknown if currently breastfeeding. ?General appearance: alert, cooperative, and no distress ?GI: soft, non-tender; bowel sounds normal; no masses,  no organomegaly ?Extremities: edema 2+ LE edema ? ?Disposition: Discharge disposition: 01-Home or Self Care ? ? ? ? ? ? ? ?Allergies as of 07/01/2021   ? ?   Reactions  ? Morphine And Related   ? Grandfather died from morphine ?States father had a severe reaction also   ? Latex Rash  ? ?  ? ?  ?Medication List  ?  ? ?TAKE these medications   ? ?acetaminophen 325 MG tablet ?Commonly known as: TYLENOL ?Take 2 tablets (650 mg total) by mouth every 6 (six) hours as needed for mild pain or headache. ?  ?cyclobenzaprine 10 MG tablet ?Commonly known as: FLEXERIL ?Take 1 tablet (10 mg total) by mouth 3 (three) times daily as needed (headache). ?  ?docusate sodium 100 MG capsule ?Commonly known as: COLACE ?Take 100 mg by mouth daily. ?  ?furosemide 20 MG tablet ?Commonly known as: LASIX ?Take 1 tablet (20 mg total) by mouth 2 (two) times daily. ?  ?ibuprofen 600 MG tablet ?Commonly known as: ADVIL ?Take 1 tablet (600 mg total) by mouth every 6 (six) hours as needed for moderate pain. ?  ?labetalol 200 MG tablet ?Commonly known as: NORMODYNE ?Take 2 tablets (400 mg total) by mouth 3 (three) times daily. ?  ?oxyCODONE 5 MG immediate release tablet ?Commonly known as: Oxy IR/ROXICODONE ?Take 1 tablet (5 mg total) by mouth every  4 (four) hours as needed for moderate pain. ?  ?prenatal multivitamin Tabs tablet ?Take 1 tablet by mouth daily at 12 noon. ?  ? ?  ? ? ? ?Signed: ?Shon Millet II ?07/01/2021, 2:29 PM ? ? ?

## 2021-07-05 ENCOUNTER — Telehealth (HOSPITAL_COMMUNITY): Payer: Self-pay | Admitting: *Deleted

## 2021-07-05 NOTE — Telephone Encounter (Signed)
Phone voicemail message left to return nurse call. ? ?Duffy Rhody, RN 07-05-2021 at 10:09am ?

## 2021-08-16 DIAGNOSIS — Z1389 Encounter for screening for other disorder: Secondary | ICD-10-CM | POA: Diagnosis not present

## 2021-08-22 DIAGNOSIS — Z30430 Encounter for insertion of intrauterine contraceptive device: Secondary | ICD-10-CM | POA: Diagnosis not present

## 2021-08-22 DIAGNOSIS — Z3202 Encounter for pregnancy test, result negative: Secondary | ICD-10-CM | POA: Diagnosis not present

## 2021-10-09 DIAGNOSIS — Z3009 Encounter for other general counseling and advice on contraception: Secondary | ICD-10-CM | POA: Diagnosis not present

## 2021-10-09 DIAGNOSIS — Z30431 Encounter for routine checking of intrauterine contraceptive device: Secondary | ICD-10-CM | POA: Diagnosis not present

## 2021-10-09 DIAGNOSIS — F32A Depression, unspecified: Secondary | ICD-10-CM | POA: Diagnosis not present

## 2021-10-09 DIAGNOSIS — Z3040 Encounter for surveillance of contraceptives, unspecified: Secondary | ICD-10-CM | POA: Diagnosis not present

## 2021-10-29 DIAGNOSIS — H6992 Unspecified Eustachian tube disorder, left ear: Secondary | ICD-10-CM | POA: Diagnosis not present

## 2021-11-08 DIAGNOSIS — H539 Unspecified visual disturbance: Secondary | ICD-10-CM | POA: Diagnosis not present
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
## 2022-03-11 DIAGNOSIS — S99921A Unspecified injury of right foot, initial encounter: Secondary | ICD-10-CM | POA: Diagnosis not present

## 2022-05-07 DIAGNOSIS — M5431 Sciatica, right side: Secondary | ICD-10-CM | POA: Diagnosis not present

## 2022-06-12 DIAGNOSIS — S9301XA Subluxation of right ankle joint, initial encounter: Secondary | ICD-10-CM | POA: Diagnosis not present

## 2022-06-12 DIAGNOSIS — M25571 Pain in right ankle and joints of right foot: Secondary | ICD-10-CM | POA: Diagnosis not present

## 2022-06-12 DIAGNOSIS — R2241 Localized swelling, mass and lump, right lower limb: Secondary | ICD-10-CM | POA: Diagnosis not present

## 2022-06-12 DIAGNOSIS — M79671 Pain in right foot: Secondary | ICD-10-CM | POA: Diagnosis not present

## 2022-06-12 DIAGNOSIS — M7731 Calcaneal spur, right foot: Secondary | ICD-10-CM | POA: Diagnosis not present

## 2022-06-14 DIAGNOSIS — M25571 Pain in right ankle and joints of right foot: Secondary | ICD-10-CM | POA: Diagnosis not present

## 2022-09-28 DIAGNOSIS — S339XXA Sprain of unspecified parts of lumbar spine and pelvis, initial encounter: Secondary | ICD-10-CM | POA: Diagnosis not present

## 2022-10-29 DIAGNOSIS — R112 Nausea with vomiting, unspecified: Secondary | ICD-10-CM | POA: Diagnosis not present

## 2022-10-29 DIAGNOSIS — R197 Diarrhea, unspecified: Secondary | ICD-10-CM | POA: Diagnosis not present

## 2022-12-02 DIAGNOSIS — R701 Abnormal plasma viscosity: Secondary | ICD-10-CM | POA: Diagnosis not present

## 2023-02-03 DIAGNOSIS — S96912A Strain of unspecified muscle and tendon at ankle and foot level, left foot, initial encounter: Secondary | ICD-10-CM | POA: Diagnosis not present

## 2023-04-24 DIAGNOSIS — J020 Streptococcal pharyngitis: Secondary | ICD-10-CM | POA: Diagnosis not present

## 2023-04-24 DIAGNOSIS — R070 Pain in throat: Secondary | ICD-10-CM | POA: Diagnosis not present

## 2023-05-18 DIAGNOSIS — J020 Streptococcal pharyngitis: Secondary | ICD-10-CM | POA: Diagnosis not present

## 2023-05-18 DIAGNOSIS — R051 Acute cough: Secondary | ICD-10-CM | POA: Diagnosis not present

## 2023-05-18 DIAGNOSIS — J101 Influenza due to other identified influenza virus with other respiratory manifestations: Secondary | ICD-10-CM | POA: Diagnosis not present

## 2023-07-03 DIAGNOSIS — Z13220 Encounter for screening for lipoid disorders: Secondary | ICD-10-CM | POA: Diagnosis not present

## 2023-07-03 DIAGNOSIS — Z0000 Encounter for general adult medical examination without abnormal findings: Secondary | ICD-10-CM | POA: Diagnosis not present

## 2023-07-03 DIAGNOSIS — Z1329 Encounter for screening for other suspected endocrine disorder: Secondary | ICD-10-CM | POA: Diagnosis not present

## 2023-07-04 DIAGNOSIS — G44209 Tension-type headache, unspecified, not intractable: Secondary | ICD-10-CM | POA: Diagnosis not present

## 2023-08-14 DIAGNOSIS — Z6841 Body Mass Index (BMI) 40.0 and over, adult: Secondary | ICD-10-CM | POA: Diagnosis not present

## 2023-08-14 DIAGNOSIS — R87610 Atypical squamous cells of undetermined significance on cytologic smear of cervix (ASC-US): Secondary | ICD-10-CM | POA: Diagnosis not present

## 2023-08-14 DIAGNOSIS — Z01419 Encounter for gynecological examination (general) (routine) without abnormal findings: Secondary | ICD-10-CM | POA: Diagnosis not present

## 2024-04-01 DIAGNOSIS — R070 Pain in throat: Secondary | ICD-10-CM | POA: Diagnosis not present

## 2024-04-01 DIAGNOSIS — J101 Influenza due to other identified influenza virus with other respiratory manifestations: Secondary | ICD-10-CM | POA: Diagnosis not present

## 2024-04-09 DIAGNOSIS — L258 Unspecified contact dermatitis due to other agents: Secondary | ICD-10-CM | POA: Diagnosis not present
